# Patient Record
Sex: Female | Born: 1935 | Race: White | Hispanic: No | Smoking: Never smoker
Health system: Southern US, Community
[De-identification: ages and names within clinical notes are randomized; demographics above are authoritative.]

## PROBLEM LIST (undated history)

## (undated) DIAGNOSIS — I10 Essential (primary) hypertension: Secondary | ICD-10-CM

## (undated) DIAGNOSIS — E039 Hypothyroidism, unspecified: Secondary | ICD-10-CM

## (undated) DIAGNOSIS — E079 Disorder of thyroid, unspecified: Secondary | ICD-10-CM

## (undated) DIAGNOSIS — M1990 Unspecified osteoarthritis, unspecified site: Secondary | ICD-10-CM

## (undated) DIAGNOSIS — R350 Frequency of micturition: Secondary | ICD-10-CM

## (undated) DIAGNOSIS — C801 Malignant (primary) neoplasm, unspecified: Secondary | ICD-10-CM

## (undated) DIAGNOSIS — I739 Peripheral vascular disease, unspecified: Secondary | ICD-10-CM

## (undated) DIAGNOSIS — C4490 Unspecified malignant neoplasm of skin, unspecified: Secondary | ICD-10-CM

## (undated) HISTORY — PX: TONSILLECTOMY: SUR1361

## (undated) HISTORY — PX: MUCOSAL ADVANCEMENT FLAP: SHX6479

## (undated) HISTORY — DX: Unspecified malignant neoplasm of skin, unspecified: C44.90

## (undated) HISTORY — DX: Disorder of thyroid, unspecified: E07.9

## (undated) HISTORY — DX: Malignant (primary) neoplasm, unspecified: C80.1

## (undated) HISTORY — PX: EYE SURGERY: SHX253

## (undated) HISTORY — DX: Unspecified osteoarthritis, unspecified site: M19.90

---

## 1998-02-24 ENCOUNTER — Other Ambulatory Visit: Admission: RE | Admit: 1998-02-24 | Discharge: 1998-02-24 | Payer: Self-pay | Admitting: *Deleted

## 1999-02-17 ENCOUNTER — Other Ambulatory Visit: Admission: RE | Admit: 1999-02-17 | Discharge: 1999-02-17 | Payer: Self-pay | Admitting: *Deleted

## 2000-03-12 ENCOUNTER — Other Ambulatory Visit: Admission: RE | Admit: 2000-03-12 | Discharge: 2000-03-12 | Payer: Self-pay | Admitting: *Deleted

## 2002-01-26 ENCOUNTER — Encounter: Payer: Self-pay | Admitting: Family Medicine

## 2002-01-26 ENCOUNTER — Encounter: Admission: RE | Admit: 2002-01-26 | Discharge: 2002-01-26 | Payer: Self-pay | Admitting: Family Medicine

## 2002-12-08 ENCOUNTER — Encounter: Admission: RE | Admit: 2002-12-08 | Discharge: 2002-12-08 | Payer: Self-pay | Admitting: Family Medicine

## 2003-01-22 ENCOUNTER — Other Ambulatory Visit: Admission: RE | Admit: 2003-01-22 | Discharge: 2003-01-22 | Payer: Self-pay | Admitting: Family Medicine

## 2004-05-30 ENCOUNTER — Encounter: Admission: RE | Admit: 2004-05-30 | Discharge: 2004-05-30 | Payer: Self-pay | Admitting: Family Medicine

## 2005-04-23 ENCOUNTER — Encounter: Admission: RE | Admit: 2005-04-23 | Discharge: 2005-04-23 | Payer: Self-pay | Admitting: Family Medicine

## 2005-04-23 ENCOUNTER — Other Ambulatory Visit: Admission: RE | Admit: 2005-04-23 | Discharge: 2005-04-23 | Payer: Self-pay | Admitting: Family Medicine

## 2006-11-01 ENCOUNTER — Ambulatory Visit: Admission: RE | Admit: 2006-11-01 | Discharge: 2006-11-01 | Payer: Self-pay | Admitting: Family Medicine

## 2006-11-01 ENCOUNTER — Ambulatory Visit: Payer: Self-pay | Admitting: Surgery

## 2006-12-11 ENCOUNTER — Ambulatory Visit: Payer: Self-pay | Admitting: Vascular Surgery

## 2007-01-15 ENCOUNTER — Ambulatory Visit: Payer: Self-pay | Admitting: Vascular Surgery

## 2007-07-23 ENCOUNTER — Ambulatory Visit: Payer: Self-pay | Admitting: Vascular Surgery

## 2008-06-17 ENCOUNTER — Ambulatory Visit (HOSPITAL_COMMUNITY): Admission: RE | Admit: 2008-06-17 | Discharge: 2008-06-17 | Payer: Self-pay | Admitting: *Deleted

## 2009-09-12 ENCOUNTER — Encounter: Admission: RE | Admit: 2009-09-12 | Discharge: 2009-09-12 | Payer: Self-pay | Admitting: Internal Medicine

## 2010-06-27 NOTE — Assessment & Plan Note (Signed)
OFFICE VISIT   ROSALIN, BUSTER C  DOB:  23-May-1925                                       07/23/2007  JYNWG#:[id number]   The patient is a 77 year old female who has evidence of venous reflux in  her lower extremities, primarily the left leg.  She returns today for  followup.  She was last seen in December of 2008.  At that time she was  noted to have incompetence of her left great saphenous system.  She has  been wearing compression stocking since then and states that her leg  symptoms, heaviness and tiredness have all improved significantly.   PHYSICAL EXAMINATION:  On exam today blood pressure is 143/72 and pulse  is 89 and regular.  Lower extremities have trace edema bilaterally.  She  has 2+ dorsalis pedis pulses bilaterally.   The patient is satisfied currently with compression therapy alone.  I  have discussed with her that if she wishes to have laser venous ablation  at some point in the future we could consider this.  However, she states  that she is currently satisfied with the current management plan of  compression therapy alone.  She will follow up on an as-needed basis.   Janetta Hora. Fields, MD  Electronically Signed   CEF/MEDQ  D:  07/23/2007  T:  07/24/2007  Job:  [id number]   cc:   Quita Skye. Artis Flock, M.D.

## 2010-06-27 NOTE — Assessment & Plan Note (Signed)
OFFICE VISIT   [name], [name]  DOB:  March 17, 1925                                       12/11/2006  ZOXWR#:[id number]   The patient is referred by Dr. Artis [name] for intermittent left leg swelling  and erythema.  The symptoms have been present for approximately a month.  She has had several courses of antibiotics and the erythema finally  resolved.  She had a duplex ultrasound, which showed no evidence of DVT.  She also had an evaluation by a dermatologist, who told her it was  vascular disease.  She has no history of DVT.  She does have a history  of some varicose veins.  She states that she has pain in the medial  aspect of her left calf, which occurs intermittently.  It can occur at  rest or with walking.  It is improved by rubbing the skin.  She denies  any trauma to the area.  She does have edema in both lower extremities,  left worse than right.  She denies history of diabetes.  She does have a  history of hypertension and elevated cholesterol.  She denies history of  coronary artery disease.   PAST SURGICAL HISTORY:  Unremarkable.   PAST MEDICAL HISTORY:  She has hypothyroidism and the other medical  problems as listed above.   MEDICATIONS:  Levothyroxine.  Lipitor.  Tarka.   She states she is allergic to [facility], which causes confusion.   FAMILY HISTORY:  Unremarkable.   SOCIAL HISTORY:  She is widowed, retired.  She is a nonsmoker.  Non-  consumer of alcohol.   REVIEW OF SYSTEMS:  She is 5 feet 4 inches, 175 pounds.  She denies  recent weight loss or gain.  CARDIAC:  She has no history of chest pain, atrial fibrillation, or  cardiac arrhythmia.  PULMONARY:  She has no history of asthma or wheezing.  GASTROINTESTINAL:  She has no history of GI bleeding.  GENITOURINARY:  She has some urinary frequency, but denies renal  dysfunction.  VASCULAR:  She has no history of stroke or TIA.  NEUROLOGIC:  She has no history of dizziness or syncopal  episodes.  ORTHO:  She has mild arthritis.  PSYCHIATRIC:  She is alert and oriented x3.  No episodes of confusion.  ENT:  She has some decrease in her hearing acuity recently.  HEMATOLOGICAL:  She has no history of hypercoagulable state or bleeding  disorders.   PHYSICAL EXAM:  Blood pressure is 153/76 in the left arm.  Heart rate is  100.  HEENT is unremarkable.  She has 2+ carotid pulses with no bruit.  Chest is clear to auscultation.  Cardiac exam is regular rate and rhythm  without murmur.  Abdomen is obese, soft, and nontender, nondistended  with no mass.  She has 2+ radial, femoral, popliteal, dorsalis pedis,  and posterior tibial pulses bilaterally.  She has thickened areas of  skin bilaterally from the knee down with 1 to 2+ edema.  These are  symmetric.  She has a healing area of thickened skin with some scaling  of the epidermis in the left gaiter area.  There is no open wound  currently.   I believe the patient has some component of venous insufficiency  contributing to her lower extremity edema.  This may have been a venous  stasis ulcer  that was present previously.  I reassure her that her  arterial circulation is in good shape, and she is not at risk of limb  loss, but also informed her that she does not have a DVT and is not at  risk for pulmonary embolus.  I believe the best option for her is  compression therapy, and I have prescribed 25 to 30 mm compression  stockings for her today.  She will follow up in 1 month to see if her  edema symptoms are improving, and at that time, we will get a venous  duplex to step her competency and patency of her deep and superficial  venous system.   Mary Hora. Fields, MD  Electronically Signed   CEF/MEDQ  D:  12/12/2006  T:  12/12/2006  Job:  [id number]   cc:   Mary [name], M.D.

## 2010-06-27 NOTE — Procedures (Signed)
LOWER EXTREMITY VENOUS REFLUX EXAM   INDICATION:  Left leg edema.  The patient also has a past history of  venous ulcer on the left.   EXAM:  Using color-flow imaging and pulse Doppler spectral analysis, the  left common femoral, superficial femoral, popliteal, posterior tibial,  greater and lesser saphenous veins are evaluated.  There is no evidence  suggesting deep venous insufficiency in the left lower extremity.   The left saphenofemoral junction is competent.  The left GSV is not  competent with the caliber as described below.   The left proximal short saphenous vein is not compressible.   GSV Diameter (used if found to be incompetent only)                                            Right    Left  Proximal Greater Saphenous Vein                    0.57 cm  Proximal-to-mid-thigh                              0.49 cm  Mid thigh                                          0.44 cm  Mid-distal thigh                                   0.46 cm  Distal thigh                                       0.34 cm  Knee                                               0.43 cm  SFJ                                                0.47 cm   IMPRESSION:  1. Left greater saphenous vein reflux is identified with the caliber      ranging from 0.43 cm to 0.47 cm knee to groin.  2. The  left greater saphenous vein is not aneurysmal.  3. The left greater saphenous vein is not tortuous.  4. The deep venous system is competent.  5. The left lesser saphenous vein is not compressible.   ___________________________________________  Janetta Hora. Darrick Penna, MD   DP/MEDQ  D:  01/15/2007  T:  01/16/2007  Job:  [id number]

## 2010-06-27 NOTE — Assessment & Plan Note (Signed)
OFFICE VISIT   Lackey, Chalee C  DOB:  03-Sep-1925                                       01/15/2007  ZOXWR#:[id number]   The patient returns for followup for her lower extremity left leg  swelling.  She was last seen December 11, 2006.  She was prescribed  compression stockings at that time.  She states she has achieved  considerable relief with this.  Her leg swelling is much improved.  She  has minimal pain.  Overall, she is fairly satisfied.   PHYSICAL EXAM:  Blood pressure 140/75, pulse 85 and regular.  Lower  extremities, she still has trace edema in the left leg compared to the  right.  However, this is significantly improved.  There is some mild  pitting.  There are no ulcerations on the feet.  She has 2+ dorsalis  pedis pulses bilaterally.  She had a venous duplex exam today, which  shows reflux in her left greater saphenous vein with a diameter of 43 to  47 mm.  She had incompetence of the left greater saphenous vein.   The patient has had significant improvement of her leg swelling with  conservative management with compression stockings alone.  I discussed  with her the possibility of ablation of her left greater saphenous vein  and informed her that, if she is doing well with compression alone, that  should be adequate treatment.  She does not want any further procedures  at this time and is quite satisfied to continue with compression therapy  alone.  She will follow up in 6 months' time or sooner if she needs new  compression stockings.   Janetta Hora. Fields, MD  Electronically Signed   CEF/MEDQ  D:  01/16/2007  T:  01/16/2007  Job:  [id number]

## 2010-06-27 NOTE — Op Note (Signed)
[name], Mary [name]              ACCOUNT NO.:  [id number]   MEDICAL RECORD NO.:  [id number]          PATIENT TYPE:  AMB   LOCATION:  ENDO                         FACILITY:  [facility]   PHYSICIAN:  Georgiana Spinner, M.D.    DATE OF BIRTH:  1925-12-17   DATE OF PROCEDURE:  06/17/2008  DATE OF DISCHARGE:                               OPERATIVE REPORT   PROCEDURE:  Colonoscopy.   INDICATIONS:  Colon cancer screening.   ANESTHESIA:  1. Fentanyl 10 mcg.  2. Versed 1 mg.   PROCEDURE:  With the patient mildly sedated in the left lateral  decubitus position, the Pentax videoscopic pediatric colonoscope was  passed under direct vision from the rectum through a diverticular-filled  sigmoid colon to reach the cecum identified by ileocecal valve and  appendiceal orifice, both of which were photographed.  From this point  the colonoscope was slowly withdrawn, taking circumferential views of  the colonic mucosa, stopping only in the rectum which appeared normal on  direct and showed hemorrhoids on retroflexed view.  The endoscope was  straightened and withdrawn.  The patient's vital signs, pulse oximeter  remained stable.  The patient tolerated the procedure well without  apparent complication.   FINDINGS:  Diverticulosis, moderately severe, of sigmoid colon;  otherwise, an unremarkable colonoscopic examination other than  hemorrhoids.   PLAN:  Have patient follow-up with me as needed.           ______________________________  Georgiana Spinner, M.D.     GMO/MEDQ  D:  06/17/2008  T:  06/17/2008  Job:  [id number]

## 2010-06-27 NOTE — Op Note (Signed)
Mary [name], Mary [name]              ACCOUNT NO.:  [id number]   MEDICAL RECORD NO.:  [id number]          PATIENT TYPE:  AMB   LOCATION:  ENDO                         FACILITY:  [facility]   PHYSICIAN:  Georgiana Spinner, M.D.    DATE OF BIRTH:  Jun 13, 1925   DATE OF PROCEDURE:  06/17/2008  DATE OF DISCHARGE:                               OPERATIVE REPORT   PROCEDURE:  Upper endoscopy.   ENDOSCOPIST:  Georgiana Spinner, M.D.   INDICATIONS:  Gastroesophageal reflux disease.   ANESTHESIA:  Fentanyl 50 mcg, Versed 6 mg.   PROCEDURE:  With the patient mildly sedated in the left lateral  decubitus position, the Pentax videoscopic endoscope was inserted in the  mouth and passed under direct vision through the esophagus, which  appeared normal and into the stomach, fundus, body, antrum, duodenal  bulb and second portion of the duodenum which all appeared normal.  From  this point, the endoscope was slowly withdrawn, taking circumferential  views of the duodenal mucosa, until the endoscope had been pulled back  into stomach and placed in retroflexion, to view the stomach from below.  The endoscope was straightened and withdrawn, taking circumferential  views of the remaining gastric and esophageal mucosa.  The patient's  vital signs and pulse oximeter remained stable.   The patient tolerated procedure well without apparent complication.   FINDINGS:  This was an unremarkable examination.   PLAN:  Proceed to colonoscopy           ______________________________  Georgiana Spinner, M.D.     GMO/MEDQ  D:  06/17/2008  T:  06/17/2008  Job:  [id number]

## 2011-12-07 ENCOUNTER — Ambulatory Visit (INDEPENDENT_AMBULATORY_CARE_PROVIDER_SITE_OTHER): Payer: Medicare Other | Admitting: Family Medicine

## 2011-12-07 VITALS — BP 126/74 | HR 95 | Temp 98.6°F | Resp 17 | Ht 63.0 in | Wt 176.0 lb

## 2011-12-07 DIAGNOSIS — N390 Urinary tract infection, site not specified: Secondary | ICD-10-CM

## 2011-12-07 DIAGNOSIS — R509 Fever, unspecified: Secondary | ICD-10-CM

## 2011-12-07 DIAGNOSIS — D72829 Elevated white blood cell count, unspecified: Secondary | ICD-10-CM

## 2011-12-07 DIAGNOSIS — R109 Unspecified abdominal pain: Secondary | ICD-10-CM

## 2011-12-07 LAB — POCT URINALYSIS DIPSTICK
Bilirubin, UA: NEGATIVE
Glucose, UA: NEGATIVE
Ketones, UA: NEGATIVE
Nitrite, UA: POSITIVE
Protein, UA: NEGATIVE
Spec Grav, UA: 1.015
Urobilinogen, UA: 0.2
pH, UA: 5.5

## 2011-12-07 LAB — POCT UA - MICROSCOPIC ONLY
Casts, Ur, LPF, POC: NEGATIVE
Crystals, Ur, HPF, POC: NEGATIVE
Mucus, UA: POSITIVE
Yeast, UA: NEGATIVE

## 2011-12-07 LAB — POCT CBC
Granulocyte percent: 72.8 %G (ref 37–80)
HCT, POC: 37.7 % (ref 37.7–47.9)
Hemoglobin: 11.6 g/dL — AB (ref 12.2–16.2)
MCH, POC: 27.8 pg (ref 27–31.2)
MCV: 90.5 fL (ref 80–97)
MID (cbc): 1.1 — AB (ref 0–0.9)
RBC: 4.17 M/uL (ref 4.04–5.48)
WBC: 13.5 10*3/uL — AB (ref 4.6–10.2)

## 2011-12-07 MED ORDER — NITROFURANTOIN MONOHYD MACRO 100 MG PO CAPS: 100.0000 mg | ORAL_CAPSULE | Freq: Two times a day (BID) | ORAL | Status: DC

## 2011-12-07 NOTE — Progress Notes (Signed)
Subjective:    Patient ID: Mary [name], female    DOB: 1925-05-29, 78 y.o.   MRN: [id number]  HPI Mary [name] is a 78 y.o. female  Felt like had temp yesterday, chills - has not measured temp.  Thought was bladder or kidney infection as had one 6 months ago.  No dysuria, no change in urinary frequency - but goes frequently with lasix.  Has had some lower r abdominal pain - toward groin on and off since yesterday.  No vomiting. Had loose stool once this morning only.  Less appetite, but drinking fluids ok.     Review of Systems  Constitutional: Positive for fever (subjective. ) and chills.  HENT: Positive for rhinorrhea (chronic with mild allergies - seasonal ). Negative for congestion and sore throat.   Respiratory: Negative for cough and shortness of breath.   Cardiovascular: Negative for chest pain.  Gastrointestinal: Positive for abdominal pain and diarrhea. Negative for nausea, vomiting, constipation, blood in stool, abdominal distention and anal bleeding.       Objective:   Physical Exam  Constitutional: She is oriented to person, place, and time. She appears well-developed and well-nourished.  HENT:  Head: Normocephalic and atraumatic.  Eyes: Pupils are equal, round, and reactive to light.  Cardiovascular: Normal rate, regular rhythm, normal heart sounds and intact distal pulses.   Pulmonary/Chest: Effort normal and breath sounds normal.  Abdominal: Soft. Normal appearance. There is tenderness (min ruq greater than rlq, but mild, no repbound, negative murphy test. ). There is no rebound, no guarding and no CVA tenderness.  Neurological: She is alert and oriented to person, place, and time.  Skin: Skin is warm and dry. No rash noted.  Psychiatric: She has a normal mood and affect. Her behavior is normal.    Results for orders placed in visit on 12/07/11  POCT UA - MICROSCOPIC ONLY      Component Value Range   WBC, Ur, HPF, POC 7-12 w/clums     RBC, urine,  microscopic 1-3     Bacteria, U Microscopic 2+     Mucus, UA positive     Epithelial cells, urine per micros 0-3     Crystals, Ur, HPF, POC neg     Casts, Ur, LPF, POC neg     Yeast, UA neg    POCT URINALYSIS DIPSTICK      Component Value Range   Color, UA yellow     Clarity, UA sl. cloudy     Glucose, UA neg     Bilirubin, UA neg     Ketones, UA neg     Spec Grav, UA 1.015     Blood, UA trace-intact     pH, UA 5.5     Protein, UA neg     Urobilinogen, UA 0.2     Nitrite, UA positive     Leukocytes, UA moderate (2+)    POCT CBC      Component Value Range   WBC 13.5 (*) 4.6 - 10.2 K/uL   Lymph, poc 2.6  0.6 - 3.4   POC LYMPH PERCENT 19.3  10 - 50 %L   MID (cbc) 1.1 (*) 0 - 0.9   POC MID % 7.9  0 - 12 %M   POC Granulocyte 9.8 (*) 2 - 6.9   Granulocyte percent 72.8  37 - 80 %G   RBC 4.17  4.04 - 5.48 M/uL   Hemoglobin 11.6 (*) 12.2 - 16.2 g/dL   HCT, POC  37.7  37.7 - 47.9 %   MCV 90.5  80 - 97 fL   MCH, POC 27.8  27 - 31.2 pg   MCHC 30.8 (*) 31.8 - 35.4 g/dL   RDW, POC 16.1     Platelet Count, POC 225  142 - 424 K/uL   MPV 10.0  0 - 99.8 fL       Assessment & Plan:  Mary [name] is a 78 y.o. female 1. Abdominal pain  POCT UA - Microscopic Only, POCT urinalysis dipstick, POCT CBC  2. Fever  POCT CBC, Urine culture  3. UTI (lower urinary tract infection)  Urine culture, nitrofurantoin, macrocrystal-monohydrate, (MACROBID) 100 MG capsule  4. Leukocytosis     Probable uti as cause of subjective f/c and leukocytosis.  Start macrobid as above, fluids, sx care and recheck in next 3-4 days if not improving, sooner if worse.   Patient Instructions  Your should receive a call or letter about your lab results within the next week to 10 days.  Start the antibiotic for a likely urinary tract (bladder) infection. If not improving in the next 4 days - return to office for recheck Return to the clinic or go to the nearest emergency room if any of your symptoms worsen or new  symptoms occur. Urinary Tract Infection Urinary tract infections (UTIs) can develop anywhere along your urinary tract. Your urinary tract is your body's drainage system for removing wastes and extra water. Your urinary tract includes two kidneys, two ureters, a bladder, and a urethra. Your kidneys are a pair of bean-shaped organs. Each kidney is about the size of your fist. They are located below your ribs, one on each side of your spine. CAUSES Infections are caused by microbes, which are microscopic organisms, including fungi, viruses, and bacteria. These organisms are so small that they can only be seen through a microscope. Bacteria are the microbes that most commonly cause UTIs. SYMPTOMS  Symptoms of UTIs may vary by age and gender of the patient and by the location of the infection. Symptoms in young women typically include a frequent and intense urge to urinate and a painful, burning feeling in the bladder or urethra during urination. Older women and men are more likely to be tired, shaky, and weak and have muscle aches and abdominal pain. A fever may mean the infection is in your kidneys. Other symptoms of a kidney infection include pain in your back or sides below the ribs, nausea, and vomiting. DIAGNOSIS To diagnose a UTI, your caregiver will ask you about your symptoms. Your caregiver also will ask to provide a urine sample. The urine sample will be tested for bacteria and white blood cells. White blood cells are made by your body to help fight infection. TREATMENT  Typically, UTIs can be treated with medication. Because most UTIs are caused by a bacterial infection, they usually can be treated with the use of antibiotics. The choice of antibiotic and length of treatment depend on your symptoms and the type of bacteria causing your infection. HOME CARE INSTRUCTIONS  If you were prescribed antibiotics, take them exactly as your caregiver instructs you. Finish the medication even if you feel  better after you have only taken some of the medication.  Drink enough water and fluids to keep your urine clear or pale yellow.  Avoid caffeine, tea, and carbonated beverages. They tend to irritate your bladder.  Empty your bladder often. Avoid holding urine for long periods of time.  Empty your  bladder before and after sexual intercourse.  After a bowel movement, women should cleanse from front to back. Use each tissue only once. SEEK MEDICAL CARE IF:   You have back pain.  You develop a fever.  Your symptoms do not begin to resolve within 3 days. SEEK IMMEDIATE MEDICAL CARE IF:   You have severe back pain or lower abdominal pain.  You develop chills.  You have nausea or vomiting.  You have continued burning or discomfort with urination. MAKE SURE YOU:   Understand these instructions.  Will watch your condition.  Will get help right away if you are not doing well or get worse. Document Released: 11/08/2004 Document Revised: 07/31/2011 Document Reviewed: 03/09/2011 [facility] Patient Information 2013 [place], [place].

## 2011-12-07 NOTE — Patient Instructions (Addendum)
Your should receive a call or letter about your lab results within the next week to 10 days.  Start the antibiotic for a likely urinary tract (bladder) infection. If not improving in the next 4 days - return to office for recheck Return to the clinic or go to the nearest emergency room if any of your symptoms worsen or new symptoms occur. Urinary Tract Infection Urinary tract infections (UTIs) can develop anywhere along your urinary tract. Your urinary tract is your body's drainage system for removing wastes and extra water. Your urinary tract includes two kidneys, two ureters, a bladder, and a urethra. Your kidneys are a pair of bean-shaped organs. Each kidney is about the size of your fist. They are located below your ribs, one on each side of your spine. CAUSES Infections are caused by microbes, which are microscopic organisms, including fungi, viruses, and bacteria. These organisms are so small that they can only be seen through a microscope. Bacteria are the microbes that most commonly cause UTIs. SYMPTOMS  Symptoms of UTIs may vary by age and gender of the patient and by the location of the infection. Symptoms in young women typically include a frequent and intense urge to urinate and a painful, burning feeling in the bladder or urethra during urination. Older women and men are more likely to be tired, shaky, and weak and have muscle aches and abdominal pain. A fever may mean the infection is in your kidneys. Other symptoms of a kidney infection include pain in your back or sides below the ribs, nausea, and vomiting. DIAGNOSIS To diagnose a UTI, your caregiver will ask you about your symptoms. Your caregiver also will ask to provide a urine sample. The urine sample will be tested for bacteria and white blood cells. White blood cells are made by your body to help fight infection. TREATMENT  Typically, UTIs can be treated with medication. Because most UTIs are caused by a bacterial infection, they  usually can be treated with the use of antibiotics. The choice of antibiotic and length of treatment depend on your symptoms and the type of bacteria causing your infection. HOME CARE INSTRUCTIONS  If you were prescribed antibiotics, take them exactly as your caregiver instructs you. Finish the medication even if you feel better after you have only taken some of the medication.  Drink enough water and fluids to keep your urine clear or pale yellow.  Avoid caffeine, tea, and carbonated beverages. They tend to irritate your bladder.  Empty your bladder often. Avoid holding urine for long periods of time.  Empty your bladder before and after sexual intercourse.  After a bowel movement, women should cleanse from front to back. Use each tissue only once. SEEK MEDICAL CARE IF:   You have back pain.  You develop a fever.  Your symptoms do not begin to resolve within 3 days. SEEK IMMEDIATE MEDICAL CARE IF:   You have severe back pain or lower abdominal pain.  You develop chills.  You have nausea or vomiting.  You have continued burning or discomfort with urination. MAKE SURE YOU:   Understand these instructions.  Will watch your condition.  Will get help right away if you are not doing well or get worse. Document Released: 11/08/2004 Document Revised: 07/31/2011 Document Reviewed: 03/09/2011 [facility] Patient Information 2013 [place], [place].

## 2011-12-10 LAB — URINE CULTURE: Colony Count: >=100000

## 2013-07-19 NOTE — H&P (Signed)
  Shan[facility] [address] [place], [place] [address] [phone] A Division of [facility]  Ninetta Lights, M.D.   Robert A. Noemi Chapel, M.D.   Faythe Casa, M.D.   Johnny Bridge, M.D.   Almedia Balls, M.D Ernesta Amble. Percell Miller, M.D.  Joseph Pierini, M.D.  Lanier Prude, M.D.    Verner Chol, M.D. Mary L. Fenton Malling, PA-C  Kirstin A. Shepperson, PA-C  [place], PA-C [place], [place]   RE:  Mary [name], Mary [name] PROGRESS NOTE: 05-20-13 Reason for visit:  Referral from Dr. Noah Delaine for evaluation of left hip pain. History of present illness: This is an 80 year old who has had long-standing pain in her left groin radiating down her left thigh. She has multiple cortisone injections to her left hip with complete relief but did not last long. She takes occasional anti-inflammatory with minimal relief.   Please see associated documentation for this clinic visit for further past medical, family, surgical and social history, review of systems, and exam findings as this was reviewed by me.  EXAMINATION: Well appearing female in no apparent distress. Left lower extremity is neurovascularly intact, skin is benign, she has positive Stinchfield, pain with hip range of motion. Negative straight leg raise. No tenderness at the knee.  X-RAYS: X-rays reviewed by me:  2 views of her pelvis taken at an outside hospital demonstrates severe arthritis.  ASSESSMENT: Left hip osteoarthritis.  PLAN: 1. I had a lengthy discussion with her roughly 45 minutes discussing options. 2. As I see it she may continue non-operative treatment with NSAID's if approved by her primary care physician. At this point another injection is unlikely to last long as the previous one only lasted a few weeks. 3. I recommend she work on weight loss and flexibility. 4. Alternatively I think it is appropriate to perform a left total hip arthroplasty as  she has failed multiple non-operative measures.  5. She will consider options and discuss with her family.  Ernesta Amble.  Percell Miller, M.D.  Electronically verified by Ernesta Amble. Percell Miller, M.D. TDM:kah Cc:  Gavin Pound, MD fax [phone]  Cc:  Thressa Sheller, MD fax [phone]   D 05-20-13 T 05-21-13

## 2013-07-29 ENCOUNTER — Encounter (HOSPITAL_COMMUNITY): Payer: Self-pay

## 2013-07-31 ENCOUNTER — Encounter (HOSPITAL_COMMUNITY)
Admission: RE | Admit: 2013-07-31 | Discharge: 2013-07-31 | Disposition: A | Discharge status: Home / Self Care | Payer: Medicare Other | Source: Ambulatory Visit | Attending: Orthopedic Surgery | Admitting: Orthopedic Surgery

## 2013-07-31 ENCOUNTER — Encounter (HOSPITAL_COMMUNITY): Payer: Self-pay

## 2013-07-31 DIAGNOSIS — Z01812 Encounter for preprocedural laboratory examination: Secondary | ICD-10-CM | POA: Insufficient documentation

## 2013-07-31 HISTORY — DX: Hypothyroidism, unspecified: E03.9

## 2013-07-31 HISTORY — DX: Essential (primary) hypertension: I10

## 2013-07-31 HISTORY — DX: Peripheral vascular disease, unspecified: I73.9

## 2013-07-31 HISTORY — DX: Frequency of micturition: R35.0

## 2013-07-31 LAB — BASIC METABOLIC PANEL
BUN: 27 mg/dL — ABNORMAL HIGH (ref 6–23)
CHLORIDE: 97 meq/L (ref 96–112)
CO2: 24 mEq/L (ref 19–32)
Calcium: 10.1 mg/dL (ref 8.4–10.5)
Creatinine, Ser: 1.23 mg/dL — ABNORMAL HIGH (ref 0.50–1.10)
GFR calc Af Amer: 45 mL/min — ABNORMAL LOW (ref >90)
GFR, EST NON AFRICAN AMERICAN: 39 mL/min — AB (ref >90)
Glucose, Bld: 97 mg/dL (ref 70–99)
POTASSIUM: 4.3 meq/L (ref 3.7–5.3)
Sodium: 138 mEq/L (ref 137–147)

## 2013-07-31 LAB — CBC
HEMATOCRIT: 35.9 % — AB (ref 36.0–46.0)
HEMOGLOBIN: 11.7 g/dL — AB (ref 12.0–15.0)
MCH: 28.6 pg (ref 26.0–34.0)
MCHC: 32.6 g/dL (ref 30.0–36.0)
MCV: 87.8 fL (ref 78.0–100.0)
Platelets: 255 10*3/uL (ref 150–400)
RBC: 4.09 MIL/uL (ref 3.87–5.11)
RDW: 12.6 % (ref 11.5–15.5)
WBC: 10.5 10*3/uL (ref 4.0–10.5)

## 2013-07-31 LAB — URINALYSIS, ROUTINE W REFLEX MICROSCOPIC
BILIRUBIN URINE: NEGATIVE
Glucose, UA: NEGATIVE mg/dL
Hgb urine dipstick: NEGATIVE
KETONES UR: NEGATIVE mg/dL
Leukocytes, UA: NEGATIVE
NITRITE: NEGATIVE
PH: 5.5 (ref 5.0–8.0)
PROTEIN: NEGATIVE mg/dL
Specific Gravity, Urine: 1.012 (ref 1.005–1.030)
Urobilinogen, UA: 0.2 mg/dL (ref 0.0–1.0)

## 2013-07-31 LAB — PROTIME-INR
INR: 0.97 (ref 0.00–1.49)
Prothrombin Time: 12.7 seconds (ref 11.6–15.2)

## 2013-07-31 LAB — ABO/RH: ABO/RH(D): A NEG

## 2013-07-31 LAB — SURGICAL PCR SCREEN
MRSA, PCR: NEGATIVE
STAPHYLOCOCCUS AUREUS: NEGATIVE

## 2013-07-31 LAB — TYPE AND SCREEN
ABO/RH(D): A NEG
ANTIBODY SCREEN: NEGATIVE

## 2013-07-31 NOTE — Pre-Procedure Instructions (Signed)
Mary [name]  07/31/2013   Your procedure is scheduled on:  08-04-2013  Tuesday   Report to [facility] Admitting at 11:20 AM.  Call this number if you have problems the morning of surgery: [phone]   Remember:   Do not eat food or drink liquids after midnight.    Take these medicines the morning of surgery with A SIP OF WATER: Gapapentin(Neurotin) if needed,levothyroxine(Synthroid)   Do not wear jewelry, make-up or nail polish.  Do not wear lotions, powders, or perfumes.   Do not shave 48 hours prior to surgery.   Do not bring valuables to the hospital.  [facility] is not responsible for any belongings or valuables.               Contacts, dentures or bridgework may not be worn into surgery.   Leave suitcase in the car. After surgery it may be brought to your room.   For patients admitted to the hospital, discharge time is determined by your  treatment team.                   Special Instructions: See attached sheet for instructions on CHG shower/bath   Please read over the following fact sheets that you were given: Pain Booklet, Coughing and Deep Breathing, Blood Transfusion Information and Surgical Site Infection Prevention

## 2013-07-31 NOTE — Progress Notes (Signed)
OV,EKG,CXR requested from [facility]. Dr. Thressa Sheller.

## 2013-08-01 LAB — URINE CULTURE: Colony Count: 2000

## 2013-08-03 MED ORDER — ACETAMINOPHEN 500 MG PO TABS: 1000.0000 mg | ORAL_TABLET | Freq: Once | ORAL | Status: DC

## 2013-08-03 MED ORDER — TRANEXAMIC ACID 100 MG/ML IV SOLN
1000.0000 mg | INTRAVENOUS | Status: AC
  Administered 2013-08-04: 1000 mg via INTRAVENOUS
  Filled 2013-08-03: qty 10

## 2013-08-03 MED ORDER — DEXTROSE-NACL 5-0.45 % IV SOLN: 100.0000 mL/h | INTRAVENOUS | Status: DC

## 2013-08-03 MED ORDER — CHLORHEXIDINE GLUCONATE 4 % EX LIQD
60.0000 mL | Freq: Once | CUTANEOUS | Status: DC
  Filled 2013-08-03: qty 60

## 2013-08-03 MED ORDER — CEFAZOLIN SODIUM-DEXTROSE 2-3 GM-% IV SOLR
2.0000 g | INTRAVENOUS | Status: AC
  Administered 2013-08-04: 2 g via INTRAVENOUS

## 2013-08-03 NOTE — Progress Notes (Signed)
Call to [facility], spoke with Lattie Haw , requested last OV note, EKG, CXR To be faxed to [facility] [phone].

## 2013-08-03 NOTE — Progress Notes (Signed)
Spoke with pt and informed her of surgery time change and to arrive at 0800 with same preop instructions.  Stated understanding.

## 2013-08-04 ENCOUNTER — Inpatient Hospital Stay (HOSPITAL_COMMUNITY): Payer: Medicare Other

## 2013-08-04 ENCOUNTER — Encounter (HOSPITAL_COMMUNITY)
Admission: RE | Disposition: A | Discharge status: Skilled Nursing Facility | Payer: Self-pay | Source: Ambulatory Visit | Attending: Orthopedic Surgery

## 2013-08-04 ENCOUNTER — Encounter (HOSPITAL_COMMUNITY): Payer: Medicare Other | Admitting: Anesthesiology

## 2013-08-04 ENCOUNTER — Inpatient Hospital Stay (HOSPITAL_COMMUNITY)
Admission: RE | Admit: 2013-08-04 | Discharge: 2013-08-07 | DRG: 470 | Disposition: A | Discharge status: Skilled Nursing Facility | Payer: Medicare Other | Source: Ambulatory Visit | Attending: Orthopedic Surgery | Admitting: Orthopedic Surgery

## 2013-08-04 ENCOUNTER — Encounter (HOSPITAL_COMMUNITY): Payer: Self-pay | Admitting: Certified Registered"

## 2013-08-04 ENCOUNTER — Inpatient Hospital Stay (HOSPITAL_COMMUNITY): Payer: Medicare Other | Admitting: Anesthesiology

## 2013-08-04 DIAGNOSIS — M1610 Unilateral primary osteoarthritis, unspecified hip: Principal | ICD-10-CM | POA: Diagnosis present

## 2013-08-04 DIAGNOSIS — Z85828 Personal history of other malignant neoplasm of skin: Secondary | ICD-10-CM

## 2013-08-04 DIAGNOSIS — E039 Hypothyroidism, unspecified: Secondary | ICD-10-CM | POA: Diagnosis present

## 2013-08-04 DIAGNOSIS — M1990 Unspecified osteoarthritis, unspecified site: Secondary | ICD-10-CM

## 2013-08-04 DIAGNOSIS — Z79899 Other long term (current) drug therapy: Secondary | ICD-10-CM

## 2013-08-04 DIAGNOSIS — I739 Peripheral vascular disease, unspecified: Secondary | ICD-10-CM | POA: Diagnosis present

## 2013-08-04 DIAGNOSIS — I10 Essential (primary) hypertension: Secondary | ICD-10-CM | POA: Diagnosis present

## 2013-08-04 DIAGNOSIS — M1612 Unilateral primary osteoarthritis, left hip: Secondary | ICD-10-CM

## 2013-08-04 DIAGNOSIS — Z7982 Long term (current) use of aspirin: Secondary | ICD-10-CM

## 2013-08-04 DIAGNOSIS — M169 Osteoarthritis of hip, unspecified: Principal | ICD-10-CM | POA: Diagnosis present

## 2013-08-04 HISTORY — PX: TOTAL HIP ARTHROPLASTY: SHX124

## 2013-08-04 SURGERY — ARTHROPLASTY, HIP, TOTAL, ANTERIOR APPROACH
Anesthesia: General | Site: Hip | Laterality: Left

## 2013-08-04 MED ORDER — FENTANYL CITRATE 0.05 MG/ML IJ SOLN
INTRAMUSCULAR | Status: AC
  Filled 2013-08-04: qty 5

## 2013-08-04 MED ORDER — MEPERIDINE HCL 25 MG/ML IJ SOLN: 6.2500 mg | INTRAMUSCULAR | Status: DC | PRN

## 2013-08-04 MED ORDER — MENTHOL 3 MG MT LOZG: 1.0000 | LOZENGE | OROMUCOSAL | Status: DC | PRN

## 2013-08-04 MED ORDER — FUROSEMIDE 20 MG PO TABS
10.0000 mg | ORAL_TABLET | ORAL | Status: DC
  Administered 2013-08-05 – 2013-08-07 (×2): 10 mg via ORAL
  Filled 2013-08-04 (×2): qty 0.5

## 2013-08-04 MED ORDER — 0.9 % SODIUM CHLORIDE (POUR BTL) OPTIME
TOPICAL | Status: DC | PRN
  Administered 2013-08-04: 1000 mL

## 2013-08-04 MED ORDER — GABAPENTIN 300 MG PO CAPS
300.0000 mg | ORAL_CAPSULE | Freq: Every day | ORAL | Status: DC | PRN
  Administered 2013-08-05: 300 mg via ORAL
  Filled 2013-08-04 (×2): qty 1

## 2013-08-04 MED ORDER — LACTATED RINGERS IV SOLN
INTRAVENOUS | Status: DC | PRN
  Administered 2013-08-04: 10:00:00 via INTRAVENOUS

## 2013-08-04 MED ORDER — SUCCINYLCHOLINE CHLORIDE 20 MG/ML IJ SOLN
INTRAMUSCULAR | Status: AC
  Filled 2013-08-04: qty 1

## 2013-08-04 MED ORDER — METOCLOPRAMIDE HCL 5 MG/ML IJ SOLN: 5.0000 mg | Freq: Three times a day (TID) | INTRAMUSCULAR | Status: DC | PRN

## 2013-08-04 MED ORDER — HYDROMORPHONE HCL PF 1 MG/ML IJ SOLN
INTRAMUSCULAR | Status: AC
  Filled 2013-08-04: qty 1

## 2013-08-04 MED ORDER — DEXTROSE-NACL 5-0.45 % IV SOLN
INTRAVENOUS | Status: DC
  Administered 2013-08-04: 23:00:00 via INTRAVENOUS

## 2013-08-04 MED ORDER — METOCLOPRAMIDE HCL 10 MG PO TABS: 5.0000 mg | ORAL_TABLET | Freq: Three times a day (TID) | ORAL | Status: DC | PRN

## 2013-08-04 MED ORDER — LACTATED RINGERS IV SOLN
INTRAVENOUS | Status: DC
  Administered 2013-08-04: 09:00:00 via INTRAVENOUS

## 2013-08-04 MED ORDER — CEFAZOLIN SODIUM-DEXTROSE 2-3 GM-% IV SOLR
2.0000 g | Freq: Four times a day (QID) | INTRAVENOUS | Status: DC
  Filled 2013-08-04 (×2): qty 50

## 2013-08-04 MED ORDER — ALBUMIN HUMAN 5 % IV SOLN
INTRAVENOUS | Status: DC | PRN
  Administered 2013-08-04 (×2): via INTRAVENOUS

## 2013-08-04 MED ORDER — MORPHINE SULFATE 2 MG/ML IJ SOLN
2.0000 mg | INTRAMUSCULAR | Status: DC | PRN
  Administered 2013-08-04 – 2013-08-06 (×3): 2 mg via INTRAVENOUS
  Filled 2013-08-04 (×3): qty 1

## 2013-08-04 MED ORDER — METHOCARBAMOL 500 MG PO TABS
500.0000 mg | ORAL_TABLET | Freq: Four times a day (QID) | ORAL | Status: DC | PRN
  Administered 2013-08-04 – 2013-08-07 (×5): 500 mg via ORAL
  Filled 2013-08-04 (×4): qty 1

## 2013-08-04 MED ORDER — PHENYLEPHRINE HCL 10 MG/ML IJ SOLN
10.0000 mg | INTRAVENOUS | Status: DC | PRN
  Administered 2013-08-04: 10 ug/min via INTRAVENOUS

## 2013-08-04 MED ORDER — ONDANSETRON HCL 4 MG PO TABS: 4.0000 mg | ORAL_TABLET | Freq: Three times a day (TID) | ORAL | Status: DC | PRN

## 2013-08-04 MED ORDER — ONDANSETRON HCL 4 MG/2ML IJ SOLN: 4.0000 mg | Freq: Once | INTRAMUSCULAR | Status: DC | PRN

## 2013-08-04 MED ORDER — ROCURONIUM BROMIDE 100 MG/10ML IV SOLN
INTRAVENOUS | Status: DC | PRN
  Administered 2013-08-04: 50 mg via INTRAVENOUS

## 2013-08-04 MED ORDER — OXYCODONE HCL 5 MG/5ML PO SOLN: 5.0000 mg | Freq: Once | ORAL | Status: DC | PRN

## 2013-08-04 MED ORDER — HYDROCODONE-ACETAMINOPHEN 5-325 MG PO TABS: 1.0000 | ORAL_TABLET | ORAL | Status: DC | PRN

## 2013-08-04 MED ORDER — ATORVASTATIN CALCIUM 10 MG PO TABS
10.0000 mg | ORAL_TABLET | Freq: Every day | ORAL | Status: DC
  Administered 2013-08-04 – 2013-08-07 (×4): 10 mg via ORAL
  Filled 2013-08-04 (×4): qty 1

## 2013-08-04 MED ORDER — CEFAZOLIN SODIUM-DEXTROSE 2-3 GM-% IV SOLR
INTRAVENOUS | Status: AC
  Filled 2013-08-04: qty 50

## 2013-08-04 MED ORDER — HYDROCODONE-ACETAMINOPHEN 5-325 MG PO TABS
ORAL_TABLET | ORAL | Status: AC
  Filled 2013-08-04: qty 2

## 2013-08-04 MED ORDER — NEOSTIGMINE METHYLSULFATE 10 MG/10ML IV SOLN
INTRAVENOUS | Status: DC | PRN
  Administered 2013-08-04: 4 mg via INTRAVENOUS

## 2013-08-04 MED ORDER — HYDROMORPHONE HCL PF 1 MG/ML IJ SOLN
0.2500 mg | INTRAMUSCULAR | Status: DC | PRN
  Administered 2013-08-04 (×2): 0.5 mg via INTRAVENOUS

## 2013-08-04 MED ORDER — DEXAMETHASONE SODIUM PHOSPHATE 10 MG/ML IJ SOLN
INTRAMUSCULAR | Status: DC | PRN
  Administered 2013-08-04: 6 mg via INTRAVENOUS
  Administered 2013-08-04: 4 mg via INTRAVENOUS

## 2013-08-04 MED ORDER — BUPIVACAINE LIPOSOME 1.3 % IJ SUSP
20.0000 mL | INTRAMUSCULAR | Status: DC
  Filled 2013-08-04: qty 20

## 2013-08-04 MED ORDER — PHENOL 1.4 % MT LIQD: 1.0000 | OROMUCOSAL | Status: DC | PRN

## 2013-08-04 MED ORDER — FUROSEMIDE 20 MG PO TABS
20.0000 mg | ORAL_TABLET | ORAL | Status: DC
  Administered 2013-08-04 – 2013-08-06 (×2): 20 mg via ORAL
  Filled 2013-08-04 (×2): qty 1

## 2013-08-04 MED ORDER — GLYCOPYRROLATE 0.2 MG/ML IJ SOLN
INTRAMUSCULAR | Status: DC | PRN
  Administered 2013-08-04: 0.2 mg via INTRAVENOUS
  Administered 2013-08-04: 0.6 mg via INTRAVENOUS
  Administered 2013-08-04: 0.2 mg via INTRAVENOUS

## 2013-08-04 MED ORDER — LEVOTHYROXINE SODIUM 50 MCG PO TABS
50.0000 ug | ORAL_TABLET | Freq: Every day | ORAL | Status: DC
  Administered 2013-08-05 – 2013-08-07 (×3): 50 ug via ORAL
  Filled 2013-08-04 (×5): qty 1

## 2013-08-04 MED ORDER — OXYCODONE HCL 5 MG PO TABS: 5.0000 mg | ORAL_TABLET | Freq: Once | ORAL | Status: DC | PRN

## 2013-08-04 MED ORDER — CELECOXIB 200 MG PO CAPS: 200.0000 mg | ORAL_CAPSULE | Freq: Two times a day (BID) | ORAL | Status: DC

## 2013-08-04 MED ORDER — DEXAMETHASONE SODIUM PHOSPHATE 10 MG/ML IJ SOLN
10.0000 mg | Freq: Three times a day (TID) | INTRAMUSCULAR | Status: AC
  Administered 2013-08-04 – 2013-08-05 (×2): 10 mg via INTRAVENOUS
  Filled 2013-08-04 (×3): qty 1

## 2013-08-04 MED ORDER — HYDROCODONE-ACETAMINOPHEN 5-325 MG PO TABS
1.0000 | ORAL_TABLET | ORAL | Status: DC | PRN
  Administered 2013-08-04 – 2013-08-07 (×11): 2 via ORAL
  Filled 2013-08-04 (×11): qty 2

## 2013-08-04 MED ORDER — ONDANSETRON HCL 4 MG/2ML IJ SOLN: 4.0000 mg | Freq: Four times a day (QID) | INTRAMUSCULAR | Status: DC | PRN

## 2013-08-04 MED ORDER — ACETAMINOPHEN 325 MG PO TABS: 650.0000 mg | ORAL_TABLET | Freq: Four times a day (QID) | ORAL | Status: DC | PRN

## 2013-08-04 MED ORDER — PROPOFOL 10 MG/ML IV BOLUS
INTRAVENOUS | Status: DC | PRN
  Administered 2013-08-04: 100 mg via INTRAVENOUS

## 2013-08-04 MED ORDER — METHOCARBAMOL 500 MG PO TABS: 500.0000 mg | ORAL_TABLET | Freq: Four times a day (QID) | ORAL | Status: DC | PRN

## 2013-08-04 MED ORDER — ONDANSETRON HCL 4 MG PO TABS: 4.0000 mg | ORAL_TABLET | Freq: Four times a day (QID) | ORAL | Status: DC | PRN

## 2013-08-04 MED ORDER — METHOCARBAMOL 1000 MG/10ML IJ SOLN
500.0000 mg | Freq: Four times a day (QID) | INTRAVENOUS | Status: DC | PRN
  Filled 2013-08-04: qty 5

## 2013-08-04 MED ORDER — VERAPAMIL HCL 240 MG (CO) PO TB24: 240.0000 mg | ORAL_TABLET | Freq: Every day | ORAL | Status: DC

## 2013-08-04 MED ORDER — ROCURONIUM BROMIDE 50 MG/5ML IV SOLN
INTRAVENOUS | Status: AC
  Filled 2013-08-04: qty 1

## 2013-08-04 MED ORDER — CELECOXIB 200 MG PO CAPS
200.0000 mg | ORAL_CAPSULE | Freq: Two times a day (BID) | ORAL | Status: DC
  Administered 2013-08-04 – 2013-08-07 (×7): 200 mg via ORAL
  Filled 2013-08-04 (×8): qty 1

## 2013-08-04 MED ORDER — DOCUSATE SODIUM 100 MG PO CAPS
100.0000 mg | ORAL_CAPSULE | Freq: Two times a day (BID) | ORAL | Status: DC
  Administered 2013-08-04 – 2013-08-07 (×6): 100 mg via ORAL
  Filled 2013-08-04 (×6): qty 1

## 2013-08-04 MED ORDER — ONDANSETRON HCL 4 MG/2ML IJ SOLN
INTRAMUSCULAR | Status: DC | PRN
  Administered 2013-08-04: 4 mg via INTRAVENOUS

## 2013-08-04 MED ORDER — LOSARTAN POTASSIUM 50 MG PO TABS
100.0000 mg | ORAL_TABLET | Freq: Every day | ORAL | Status: DC
  Administered 2013-08-04 – 2013-08-07 (×4): 100 mg via ORAL
  Filled 2013-08-04 (×4): qty 2

## 2013-08-04 MED ORDER — SODIUM CHLORIDE 0.9 % IR SOLN
Status: DC | PRN
  Administered 2013-08-04: 500 mL

## 2013-08-04 MED ORDER — LIDOCAINE HCL (CARDIAC) 20 MG/ML IV SOLN
INTRAVENOUS | Status: DC | PRN
  Administered 2013-08-04: 60 mg via INTRAVENOUS

## 2013-08-04 MED ORDER — FENTANYL CITRATE 0.05 MG/ML IJ SOLN
INTRAMUSCULAR | Status: DC | PRN
  Administered 2013-08-04 (×4): 50 ug via INTRAVENOUS
  Administered 2013-08-04: 150 ug via INTRAVENOUS

## 2013-08-04 MED ORDER — CEFAZOLIN SODIUM-DEXTROSE 2-3 GM-% IV SOLR
2.0000 g | Freq: Three times a day (TID) | INTRAVENOUS | Status: DC
  Administered 2013-08-04 – 2013-08-05 (×3): 2 g via INTRAVENOUS
  Filled 2013-08-04 (×6): qty 50

## 2013-08-04 MED ORDER — METHOCARBAMOL 500 MG PO TABS
ORAL_TABLET | ORAL | Status: AC
Start: 2013-08-04 — End: 2013-08-05
  Filled 2013-08-04: qty 1

## 2013-08-04 MED ORDER — VERAPAMIL HCL ER 240 MG PO TBCR
240.0000 mg | EXTENDED_RELEASE_TABLET | Freq: Every day | ORAL | Status: DC
  Administered 2013-08-04 – 2013-08-06 (×3): 240 mg via ORAL
  Filled 2013-08-04 (×5): qty 1

## 2013-08-04 MED ORDER — ASPIRIN EC 325 MG PO TBEC: 325.0000 mg | DELAYED_RELEASE_TABLET | Freq: Every day | ORAL | Status: DC

## 2013-08-04 MED ORDER — LOSARTAN POTASSIUM 50 MG PO TABS: 100.0000 mg | ORAL_TABLET | Freq: Every day | ORAL | Status: DC

## 2013-08-04 MED ORDER — PROPOFOL 10 MG/ML IV BOLUS
INTRAVENOUS | Status: AC
  Filled 2013-08-04: qty 20

## 2013-08-04 MED ORDER — ASPIRIN EC 325 MG PO TBEC
325.0000 mg | DELAYED_RELEASE_TABLET | Freq: Every day | ORAL | Status: DC
  Administered 2013-08-05 – 2013-08-07 (×3): 325 mg via ORAL
  Filled 2013-08-04 (×4): qty 1

## 2013-08-04 MED ORDER — BUPIVACAINE LIPOSOME 1.3 % IJ SUSP
INTRAMUSCULAR | Status: DC | PRN
  Administered 2013-08-04: 20 mL

## 2013-08-04 MED ORDER — ACETAMINOPHEN 650 MG RE SUPP: 650.0000 mg | Freq: Four times a day (QID) | RECTAL | Status: DC | PRN

## 2013-08-04 MED ORDER — CELECOXIB 200 MG PO CAPS
ORAL_CAPSULE | ORAL | Status: AC
Start: 2013-08-04 — End: 2013-08-05
  Filled 2013-08-04: qty 1

## 2013-08-04 MED ORDER — DEXAMETHASONE 4 MG PO TABS
10.0000 mg | ORAL_TABLET | Freq: Three times a day (TID) | ORAL | Status: AC
  Administered 2013-08-05: 10 mg via ORAL
  Filled 2013-08-04 (×3): qty 1

## 2013-08-04 MED ORDER — DOCUSATE SODIUM 100 MG PO CAPS: 100.0000 mg | ORAL_CAPSULE | Freq: Two times a day (BID) | ORAL | Status: AC

## 2013-08-04 SURGICAL SUPPLY — 66 items
APL SKNCLS STERI-STRIP NONHPOA (GAUZE/BANDAGES/DRESSINGS) ×1
BENZOIN TINCTURE PRP APPL 2/3 (GAUZE/BANDAGES/DRESSINGS) ×2 IMPLANT
BLADE SAW SGTL 18X1.27X75 (BLADE) ×2 IMPLANT
BLADE SAW SGTL 18X1.27X75MM (BLADE) ×1
BLADE SURG ROTATE 9660 (MISCELLANEOUS) IMPLANT
CLOSURE STERI-STRIP 1/2X4 (GAUZE/BANDAGES/DRESSINGS) ×1
CLSR STERI-STRIP ANTIMIC 1/2X4 (GAUZE/BANDAGES/DRESSINGS) ×1 IMPLANT
COVER SURGICAL LIGHT HANDLE (MISCELLANEOUS) ×3 IMPLANT
DRAPE C-ARM 42X72 X-RAY (DRAPES) ×1 IMPLANT
DRAPE IMP U-DRAPE 54X76 (DRAPES) ×3 IMPLANT
DRAPE INCISE IOBAN 66X45 STRL (DRAPES) ×3 IMPLANT
DRAPE ORTHO SPLIT 77X108 STRL (DRAPES) ×6
DRAPE PROXIMA HALF (DRAPES) ×3 IMPLANT
DRAPE SURG 17X23 STRL (DRAPES) ×3 IMPLANT
DRAPE SURG ORHT 6 SPLT 77X108 (DRAPES) ×2 IMPLANT
DRAPE U-SHAPE 47X51 STRL (DRAPES) ×4 IMPLANT
DRSG AQUACEL AG ADV 3.5X10 (GAUZE/BANDAGES/DRESSINGS) ×3 IMPLANT
DURAPREP 26ML APPLICATOR (WOUND CARE) ×3 IMPLANT
ELECT BLADE 4.0 EZ CLEAN MEGAD (MISCELLANEOUS) ×3
ELECT CAUTERY BLADE 6.4 (BLADE) ×3 IMPLANT
ELECT REM PT RETURN 9FT ADLT (ELECTROSURGICAL) ×3
ELECTRODE BLDE 4.0 EZ CLN MEGD (MISCELLANEOUS) ×1 IMPLANT
ELECTRODE REM PT RTRN 9FT ADLT (ELECTROSURGICAL) ×1 IMPLANT
FACESHIELD WRAPAROUND (MASK) ×6 IMPLANT
FACESHIELD WRAPAROUND OR TEAM (MASK) ×2 IMPLANT
GLOVE BIO SURGEON STRL SZ7.5 (GLOVE) ×5 IMPLANT
GLOVE BIOGEL M 7.0 STRL (GLOVE) IMPLANT
GLOVE BIOGEL PI IND STRL 7.0 (GLOVE) IMPLANT
GLOVE BIOGEL PI IND STRL 7.5 (GLOVE) IMPLANT
GLOVE BIOGEL PI IND STRL 8 (GLOVE) ×2 IMPLANT
GLOVE BIOGEL PI INDICATOR 7.0 (GLOVE) ×2
GLOVE BIOGEL PI INDICATOR 7.5 (GLOVE)
GLOVE BIOGEL PI INDICATOR 8 (GLOVE) ×6
GLOVE BIOGEL PI ORTHO PRO SZ8 (GLOVE)
GLOVE PI ORTHO PRO STRL SZ8 (GLOVE) IMPLANT
GLOVE SURG ORTHO 8.0 STRL STRW (GLOVE) IMPLANT
GLOVE SURG SS PI 7.0 STRL IVOR (GLOVE) ×2 IMPLANT
GOWN STRL REUS W/ TWL LRG LVL3 (GOWN DISPOSABLE) ×3 IMPLANT
GOWN STRL REUS W/ TWL XL LVL3 (GOWN DISPOSABLE) ×1 IMPLANT
GOWN STRL REUS W/TWL LRG LVL3 (GOWN DISPOSABLE) ×9
GOWN STRL REUS W/TWL XL LVL3 (GOWN DISPOSABLE) ×3
HIP/CERM HD VIT E LINR LEV 1C ×2 IMPLANT
KIT BASIN OR (CUSTOM PROCEDURE TRAY) ×3 IMPLANT
KIT ROOM TURNOVER OR (KITS) ×3 IMPLANT
MANIFOLD NEPTUNE II (INSTRUMENTS) ×1 IMPLANT
NDL SAFETY ECLIPSE 18X1.5 (NEEDLE) ×1 IMPLANT
NEEDLE HYPO 18GX1.5 SHARP (NEEDLE) ×3
NS IRRIG 1000ML POUR BTL (IV SOLUTION) ×3 IMPLANT
PACK TOTAL JOINT (CUSTOM PROCEDURE TRAY) ×3 IMPLANT
PAD ARMBOARD 7.5X6 YLW CONV (MISCELLANEOUS) ×6 IMPLANT
PILLOW ABDUCTION HIP (SOFTGOODS) IMPLANT
SEALER BIPOLAR AQUA 6.0 (INSTRUMENTS) ×2 IMPLANT
SPONGE LAP 18X18 X RAY DECT (DISPOSABLE) IMPLANT
SUT MNCRL AB 4-0 PS2 18 (SUTURE) ×3 IMPLANT
SUT MON AB 2-0 CT1 36 (SUTURE) ×3 IMPLANT
SUT VIC AB 0 CT1 27 (SUTURE) ×3
SUT VIC AB 0 CT1 27XBRD ANBCTR (SUTURE) ×1 IMPLANT
SUT VIC AB 1 CT1 27 (SUTURE) ×3
SUT VIC AB 1 CT1 27XBRD ANBCTR (SUTURE) ×1 IMPLANT
SYR 50ML LL SCALE MARK (SYRINGE) ×3 IMPLANT
TOWEL OR 17X24 6PK STRL BLUE (TOWEL DISPOSABLE) ×3 IMPLANT
TOWEL OR 17X26 10 PK STRL BLUE (TOWEL DISPOSABLE) ×3 IMPLANT
TOWEL OR NON WOVEN STRL DISP B (DISPOSABLE) ×3 IMPLANT
TRAY CATH 16FR W/PLASTIC CATH (SET/KITS/TRAYS/PACK) ×2 IMPLANT
TRAY FOLEY CATH 16FRSI W/METER (SET/KITS/TRAYS/PACK) IMPLANT
WATER STERILE IRR 1000ML POUR (IV SOLUTION) ×3 IMPLANT

## 2013-08-04 NOTE — Progress Notes (Signed)
Utilization review completed.  

## 2013-08-04 NOTE — Anesthesia Preprocedure Evaluation (Addendum)
Anesthesia Evaluation  Patient identified by MRN, date of birth, ID band Patient awake    Reviewed: Allergy & Precautions, H&P , NPO status , Patient's Chart, lab work & pertinent test results, reviewed documented beta blocker date and time   History of Anesthesia Complications (+) PROLONGED EMERGENCE  Airway Mallampati: I TM Distance: >3 FB Neck ROM: Full    Dental  (+) Teeth Intact, Dental Advisory Given   Pulmonary  breath sounds clear to auscultation        Cardiovascular hypertension, Pt. on medications + Peripheral Vascular Disease Rhythm:Regular     Neuro/Psych    GI/Hepatic   Endo/Other  Hypothyroidism   Renal/GU      Musculoskeletal   Abdominal (+)  Abdomen: soft. Bowel sounds: normal.  Peds  Hematology   Anesthesia Other Findings   Reproductive/Obstetrics                       Anesthesia Physical Anesthesia Plan  ASA: III  Anesthesia Plan: General   Post-op Pain Management:    Induction: Intravenous  Airway Management Planned: Oral ETT  Additional Equipment:   Intra-op Plan:   Post-operative Plan: Extubation in OR  Informed Consent: I have reviewed the patients History and Physical, chart, labs and discussed the procedure including the risks, benefits and alternatives for the proposed anesthesia with the patient or authorized representative who has indicated his/her understanding and acceptance.     Plan Discussed with: CRNA and Surgeon  Anesthesia Plan Comments:        Anesthesia Quick Evaluation

## 2013-08-04 NOTE — Anesthesia Procedure Notes (Signed)
Procedure Name: Intubation Date/Time: 08/04/2013 10:05 AM Performed by: Maeola Harman Pre-anesthesia Checklist: Patient identified, Emergency Drugs available, Suction available, Patient being monitored and Timeout performed Patient Re-evaluated:Patient Re-evaluated prior to inductionOxygen Delivery Method: Circle system utilized Preoxygenation: Pre-oxygenation with 100% oxygen Intubation Type: IV induction Ventilation: Mask ventilation without difficulty Laryngoscope Size: Mac and 3 Grade View: Grade I Tube type: Oral Tube size: 7.5 mm Number of attempts: 1 Airway Equipment and Method: Stylet Placement Confirmation: ETT inserted through vocal cords under direct vision,  positive ETCO2 and breath sounds checked- equal and bilateral Secured at: 22 cm Tube secured with: Tape Dental Injury: Teeth and Oropharynx as per pre-operative assessment  Comments: Easy atraumatic induction and intubation with MAC 3 blade.  Dr. Conrad Oakdale verified placement of ETT.  Waldron Session, CRNA

## 2013-08-04 NOTE — Progress Notes (Signed)
Unable to obtain oral/temporal or rectal temp, feels warm to touch and pt denies feeling cold and hase not exhibited any signs of same. Will continue Bair hugger and recheck temp in 15 mins

## 2013-08-04 NOTE — Transfer of Care (Signed)
Immediate Anesthesia Transfer of Care Note  Patient: Mary [name]  Procedure(s) Performed: Procedure(s): LEFT TOTAL HIP ARTHROPLASTY ANTERIOR APPROACH (Left)  Patient Location: PACU  Anesthesia Type:General  Level of Consciousness: awake, alert  and sedated  Airway & Oxygen Therapy: Patient connected to face mask oxygen  Post-op Assessment: Report given to PACU RN  Post vital signs: stable  Complications: No apparent anesthesia complications

## 2013-08-04 NOTE — Discharge Instructions (Signed)
Maintain anterior hip precautions  Bear weight as tolerated  Take a full strength aspirin daily for 30 days

## 2013-08-04 NOTE — Op Note (Signed)
08/04/2013  12:02 PM  PATIENT:  Mary [name]   MRN: [id number]  PRE-OPERATIVE DIAGNOSIS:  OA LEFT HIP  POST-OPERATIVE DIAGNOSIS:  OA LEFT HIP  PROCEDURE:  Procedure(s): LEFT TOTAL HIP ARTHROPLASTY ANTERIOR APPROACH  PREOPERATIVE INDICATIONS:    [name] is an 80 y.o. female who has a diagnosis of <principal problem not specified> and elected for surgical management after failing conservative treatment.  The risks benefits and alternatives were discussed with the patient including but not limited to the risks of nonoperative treatment, versus surgical intervention including infection, bleeding, nerve injury, periprosthetic fracture, the need for revision surgery, dislocation, leg length discrepancy, blood clots, cardiopulmonary complications, morbidity, mortality, among others, and they were willing to proceed.     OPERATIVE REPORT     SURGEON:   Renette Butters, MD    ASSISTANT:  Joya Gaskins OPA     ANESTHESIA:  General    COMPLICATIONS:  None.     COMPONENTS:  Stryker acolade fit femur size 6 with a 36 mm -2.5 head ball and a PSL acetabular shell size 52 with a  polyethylene liner    PROCEDURE IN DETAIL:   The patient was met in the holding area and  identified.  The appropriate hip was identified and marked at the operative site.  The patient was then transported to the OR  and  placed under general anesthesia.  At that point, the patient was  placed in the supine position and  secured to the operating room table and all bony prominences padded. He received pre-operative antibiotics    The operative lower extremity was prepped from the iliac crest to the distal leg.  Sterile draping was performed.  Time out was performed prior to incision.      Skin incision was made just 3 cm distal to the ASIS and 3 cm posterior to it extending in line with the tensor fascia lata. Electrocautery was used to control all bleeders. I dissected down sharply to the fascia of the  tensor fascia lata was confirmed that the muscle fibers beneath were running posteriorly. I then incised the fascia over the superficial tensor fascia lata in line with the incision. The fascia was elevated off the anterior aspect of the muscle the muscle was retracted posteriorly and protected throughout the case. I then used electrocautery to incise the tensor fascia lata fascia control and all bleeders. Immediately visible was the fat over top of the anterior neck and capsule.  I removed the anterior fat from the capsule and elevated the rectus muscle off of the anterior capsule. I then removed a large time of capsule. The retractors were then placed over the anterior acetabulum as well as around the superior and inferior neck.  I then removed a section of the femoral neck and a napkin ring fashion. Then used the power course to remove the femoral head from the acetabulum and thoroughly irrigated the acetabulum. I sized the femoral head.    I then exposed the deep acetabulum, cleared out any tissue including the ligamentum teres.   After adequate visualization, I excised the labrum, and then sequentially reamed.  I placed the trial acetabulum, which seated nicely, and then impacted the real cup into place.  Appropriate version and inclination was confirmed clinically matching their bony anatomy, and also with the use transverse acetabular ligament.  I then abducted the leg and released the external rotators from the posterior femur allowing it to be easily delivered up lateral and anterior  to the acetabulum for preparation of the femoral canal.    I then prepared the proximal femur using the cookie-cutter and then sequentially reamed and broached.  A trial broach, neck, and head was utilized, and I reduced the hip and it was found to have excellent stability with functional range of motion. The trial components were then removed, and the real polyethylene liner was placed with the lip directed  posteriorly.  I then impacted the real femoral prosthesis into place into the appropriate version, slightly anteverted to the normal anatomy, and I impacted the real head ball into place. The hip was then reduced and taken through functional range of motion and found to have excellent stability. Leg lengths were restored.  I then irrigated the hip copiously again with, and repaired the fascia with Vicryl, followed by monocryl for the subcutaneous tissue, Monocryl for the skin, Steri-Strips and sterile gauze. The wounds were injected. The patient was then awakened and returned to PACU in stable and satisfactory condition. There were no complications.  POST OPERATIVE PLAN: WBAT, DVT px: SCD's/TED and ASA 325 daily  Edmonia Lynch, MD Orthopedic Surgeon [phone]   08/04/2013 12:02 PM

## 2013-08-04 NOTE — Interval H&P Note (Signed)
History and Physical Interval Note:  08/04/2013 8:33 AM  Mary [name]  has presented today for surgery, with the diagnosis of OA LEFT KNEE  The various methods of treatment have been discussed with the patient and family. After consideration of risks, benefits and other options for treatment, the patient has consented to  Procedure(s): LEFT TOTAL HIP ARTHROPLASTY ANTERIOR APPROACH (Left) as a surgical intervention .  The patient's history has been reviewed, patient examined, no change in status, stable for surgery.  I have reviewed the patient's chart and labs.  Questions were answered to the patient's satisfaction.     MURPHY, TIMOTHY, D

## 2013-08-04 NOTE — Anesthesia Postprocedure Evaluation (Signed)
Anesthesia Post Note  Patient: Mary [name]  Procedure(s) Performed: Procedure(s) (LRB): LEFT TOTAL HIP ARTHROPLASTY ANTERIOR APPROACH (Left)  Anesthesia type: general  Patient location: PACU  Post pain: Pain level controlled  Post assessment: Patient's Cardiovascular Status Stable  Last Vitals:  Filed Vitals:   08/04/13 1656  BP: 131/70  Pulse: 84  Temp: 36.7 C  Resp: 20    Post vital signs: Reviewed and stable  Level of consciousness: sedated  Complications: No apparent anesthesia complications

## 2013-08-05 LAB — BASIC METABOLIC PANEL
BUN: 24 mg/dL — AB (ref 6–23)
CHLORIDE: 99 meq/L (ref 96–112)
CO2: 22 meq/L (ref 19–32)
Calcium: 8.8 mg/dL (ref 8.4–10.5)
Creatinine, Ser: 1.27 mg/dL — ABNORMAL HIGH (ref 0.50–1.10)
GFR calc Af Amer: 43 mL/min — ABNORMAL LOW (ref >90)
GFR calc non Af Amer: 37 mL/min — ABNORMAL LOW (ref >90)
GLUCOSE: 175 mg/dL — AB (ref 70–99)
POTASSIUM: 5 meq/L (ref 3.7–5.3)
SODIUM: 136 meq/L — AB (ref 137–147)

## 2013-08-05 NOTE — Progress Notes (Signed)
I participated in the care of this patient and agree with the above history, physical and evaluation. I performed a review of the history and a physical exam as detailed   Timothy Daniel Murphy MD  

## 2013-08-05 NOTE — Clinical Social Work Psychosocial (Signed)
Clinical Social Work Department BRIEF PSYCHOSOCIAL ASSESSMENT 08/05/2013  Patient:  Mary [name], Mary [name]     Account Number:  [id number]     Admit date:  08/04/2013  Clinical Social Worker:  Daiva Huge  Date/Time:  08/05/2013 04:34 PM  Referred by:  Physician  Date Referred:  08/05/2013 Referred for  SNF Placement   Other Referral:   Interview type:  Patient Other interview type:   Daughter at bedside [phone]    PSYCHOSOCIAL DATA Living Status:  ALONE Admitted from facility:   Level of care:   Primary support name:  daughters Primary support relationship to patient:  FAMILY Degree of support available:   good    CURRENT CONCERNS Current Concerns  Post-Acute Placement   Other Concerns:    SOCIAL WORK ASSESSMENT / PLAN Patient open to SNF- she has pre-registered at [facility] and we anticipate dc Friday-   Assessment/plan status:  Other - See comment Other assessment/ plan:   Information/referral to community resources:   SNF list    PATIENT'S/FAMILY'S RESPONSE TO PLAN OF CARE: Patient and daughter are agreeable to SNF plans and appreciative of CSW assist.       Eduard Clos, MSW, [place] [phone]

## 2013-08-05 NOTE — Progress Notes (Signed)
Patient ID: Mary [name], female   DOB: 11-18-1925, 80 y.o.   MRN: [id number]     Subjective:  Patient reports pain as mild to moderate.  Patient sitting on the side of the bed after using the bedside toilet.  She states that the leg feels week.  Objective:   VITALS:   Filed Vitals:   08/04/13 1656 08/04/13 2042 08/05/13 0022 08/05/13 0522  BP: 131/70 153/76 131/59 136/68  Pulse: 84 99 84 80  Temp: 98.1 F (36.7 C) 97.2 F (36.2 C) 97.5 F (36.4 C) 97.3 F (36.3 C)  TempSrc: Oral Oral  Oral  Resp: 20 20 20 20   Height:      Weight:      SpO2: 100% 97% 100% 100%    ABD soft Sensation intact distally Dorsiflexion/Plantar flexion intact Incision: dressing C/D/I and no drainage   Lab Results  Component Value Date   WBC 10.5 07/31/2013   HGB 11.7* 07/31/2013   HCT 35.9* 07/31/2013   MCV 87.8 07/31/2013   PLT 255 07/31/2013     Assessment/Plan: 1 Day Post-Op   Active Problems:   DJD (degenerative joint disease)   Advance diet Up with therapy Plan for SNF when available WBAT Dry dressing PRN    Remonia Richter 08/05/2013, 7:53 AM   Edmonia Lynch MD [phone]

## 2013-08-05 NOTE — Progress Notes (Signed)
OT Cancellation Note  Patient Details Name: Mary [name] MRN: [id number] DOB: 13-Jan-1926   Cancelled Treatment:    Reason Eval/Treat Not Completed: Other (comment) (Pt going SNF. Deferring OT needs to next venue.)  Hortencia Pilar 08/05/2013, 1:11 PM

## 2013-08-05 NOTE — Clinical Social Work Placement (Addendum)
Clinical Social Work Department CLINICAL SOCIAL WORK PLACEMENT NOTE 08/05/2013  Patient:  Mary [name], Mary [name]  Account Number:  [id number] Admit date:  08/04/2013  Clinical Social Worker:  Daiva Huge  Date/time:  08/05/2013 04:44 PM  Clinical Social Work is seeking post-discharge placement for this patient at the following level of care:   SKILLED NURSING   (*CSW will update this form in Epic as items are completed)   08/05/2013  Patient/family provided with [facility] Department of Clinical Social Work's list of facilities offering this level of care within the geographic area requested by the patient (or if unable, by the patient's family).  08/05/2013  Patient/family informed of their freedom to choose among providers that offer the needed level of care, that participate in Medicare, Medicaid or managed care program needed by the patient, have an available bed and are willing to accept the patient.  08/05/2013  Patient/family informed of [facility]' ownership interest in [facility], as well as of the fact that they are under no obligation to receive care at this facility.  PASARR submitted to EDS on 08/05/2013 PASARR number received on 08/05/2013  FL2 transmitted to all facilities in geographic area requested by pt/family on  08/05/2013 FL2 transmitted to all facilities within larger geographic area on   Patient informed that his/her managed care company has contracts with or will negotiate with  certain facilities, including the following:     Patient/family informed of bed offers received:  08/05/13 Patient chooses bed at [facility] Physician recommends and patient chooses bed at    Patient to be transferred to [facility] on  08/07/13 Patient to be transferred to facility by PTAR Patient and family notified of transfer on  Name of family member notified:    The following physician request were entered in Epic:   Additional Comments: CSW  notified pt and daughter that [facility] is ready to take pt this morning. Daughter requested transport for 11am; RN in room and agreed. Discharge packet placed on chart. [facility]AR scheduled. CSW signing off.   Eduard Clos, MSW, [place]

## 2013-08-05 NOTE — Progress Notes (Signed)
Physical Therapy Treatment Patient Details Name: Mary [name] MRN: [id number] DOB: Jul 20, 1925 Today's Date: 08/05/2013    History of Present Illness Lt THA (direct anterior) 08/04/13    PT Comments    Pt slowly progressing with therapy. Pt demo increased difficulty with sit to stand from bed this session. Will cont to recommend SNF for post acute rehab due to lack of support at home. Will cont to follow per POC.   Follow Up Recommendations  SNF;Supervision/Assistance - 24 hour     Equipment Recommendations  Rolling walker with 5" wheels    Recommendations for Other Services       Precautions / Restrictions Precautions Precautions: Fall Restrictions Weight Bearing Restrictions: Yes LLE Weight Bearing: Weight bearing as tolerated    Mobility  Bed Mobility Overal bed mobility: Needs Assistance Bed Mobility: Supine to Sit;Sit to Supine     Supine to sit: Min assist;HOB elevated Sit to supine: Min assist   General bed mobility comments: (A) to advance Lt LE off and onto bed; pt requires incr time due to pain; relies on handrails; cues for sequencing  Transfers Overall transfer level: Needs assistance Equipment used: Rolling walker (2 wheeled) Transfers: Sit to/from Stand Sit to Stand: Mod assist;From elevated surface         General transfer comment: pt with incr difficulty achieving sit to stand this session; bed elevated and mod (A) to achieve; cues for sequencing and hand placement with RW   Ambulation/Gait Ambulation/Gait assistance: Min assist Ambulation Distance (Feet): 20 Feet Assistive device: Rolling walker (2 wheeled) Gait Pattern/deviations: Step-through pattern;Decreased stance time - left;Decreased step length - right;Wide base of support;Trunk flexed;Antalgic Gait velocity: very slow due to pain  Gait velocity interpretation: <1.8 ft/sec, indicative of risk for recurrent falls General Gait Details: verbal and visual cues for gt sequencing and  upright posture; (A) to maintain balance and manage RW    Stairs            Wheelchair Mobility    Modified Rankin (Stroke Patients Only)       Balance Overall balance assessment: Needs assistance Sitting-balance support: Feet supported;No upper extremity supported Sitting balance-Leahy Scale: Fair Sitting balance - Comments: performed exercises at EOB; no c/o dizziness   Standing balance support: During functional activity;Bilateral upper extremity supported Standing balance-Leahy Scale: Poor Standing balance comment: relies on RW for support                    Cognition Arousal/Alertness: Awake/alert Behavior During Therapy: WFL for tasks assessed/performed Overall Cognitive Status: Within Functional Limits for tasks assessed                      Exercises Total Joint Exercises Ankle Circles/Pumps: AROM;Strengthening;Both;10 reps;Supine Quad Sets: AROM;Strengthening;Left;10 reps;Seated Short Arc Quad: AAROM;Left;5 reps;Supine Heel Slides: AAROM;10 reps;Left;Strengthening;Supine Long Arc Quad: AAROM;Left;10 reps;Seated;Other (comment) (cues to slow down and control contraction )    General Comments        Pertinent Vitals/Pain C/o 4-5/10 pain; RN notified that pt is requesting pain meds. patient repositioned for comfort     Home Living Family/patient expects to be discharged to:: Skilled nursing facility Living Arrangements: Alone             Additional Comments: Pt lives alone; plans to D/C to [facility] for rehab    Prior Function Level of Independence: Independent          PT Goals (current goals can now be found in the care  plan section) Acute Rehab PT Goals Patient Stated Goal: to get rehab then home PT Goal Formulation: With patient Time For Goal Achievement: 08/12/13 Potential to Achieve Goals: Good Progress towards PT goals: Progressing toward goals    Frequency  7X/week    PT Plan Current plan remains appropriate     Co-evaluation             End of Session Equipment Utilized During Treatment: Gait belt Activity Tolerance: Patient tolerated treatment well Patient left: in bed;with call bell/phone within reach;with bed alarm set     Time: 2585-2778 PT Time Calculation (min): 25 min  Charges:  $Gait Training: 8-22 mins $Therapeutic Exercise: 8-22 mins                    G CodesGustavus Bryant, [place] [phone] 08/05/2013, 3:49 PM

## 2013-08-05 NOTE — Care Management Note (Signed)
CARE MANAGEMENT NOTE 08/05/2013  Patient:  Mary [name], Mary [name]   Account Number:  [id number]  Date Initiated:  08/05/2013  Documentation initiated by:  Ricki Miller  Subjective/Objective Assessment:   80 yr old female s/p left total hip, anterior approach.     Action/Plan:   Patient is for shortterm rehab at SNF, wants [facility]. Social worker is aware. Patient was preoperatively setup with [facility].   Anticipated DC Date:  08/07/2013   Anticipated DC Plan:  SKILLED NURSING FACILITY  In-house referral  Clinical Social Worker      DC Planning Services  CM consult      [facility] Choice  NA   Choice offered to / List presented to:     DME arranged  NA        [place] arranged  NA      Status of service:  Completed, signed off Medicare Important Message given?   (If response is "NO", the following Medicare IM given date fields will be blank) Date Medicare IM given:   Date Additional Medicare IM given:    Discharge Disposition:  [place]  Per UR Regulation:  Reviewed for med. necessity/level of care/duration of stay

## 2013-08-05 NOTE — Evaluation (Signed)
Physical Therapy Evaluation Patient Details Name: Mary [name] MRN: [id number] DOB: 1925-02-27 Today's Date: 08/05/2013   History of Present Illness  Lt THA (direct anterior) 08/04/13  Clinical Impression  Pt is s/p Lt THA POD # 1 resulting in the deficits listed below (see PT Problem List). Pt will benefit from skilled PT to increase their independence and safety with mobility to allow discharge to the venue listed below. Pt lives alone and plans to D/C to [facility] for post acute rehab.      Follow Up Recommendations SNF;Supervision/Assistance - 24 hour    Equipment Recommendations  Rolling walker with 5" wheels    Recommendations for Other Services       Precautions / Restrictions Precautions Precautions: Fall Restrictions Weight Bearing Restrictions: Yes LLE Weight Bearing: Weight bearing as tolerated      Mobility  Bed Mobility Overal bed mobility: Needs Assistance Bed Mobility: Supine to Sit     Supine to sit: Min assist     General bed mobility comments: (A) to advance Lt LE to/off EOB; cues for hand placement and sequencing   Transfers Overall transfer level: Needs assistance Equipment used: Rolling walker (2 wheeled) Transfers: Sit to/from Stand Sit to Stand: Min assist         General transfer comment: (A) to maintain balance and to acheive upright standing position; pt limited with amount she can WB through Lt LE due to pain; cues for hand placement and safety with RW   Ambulation/Gait Ambulation/Gait assistance: Min assist Ambulation Distance (Feet): 8 Feet Assistive device: Rolling walker (2 wheeled) Gait Pattern/deviations: Step-through pattern;Decreased stance time - left;Decreased step length - right;Antalgic;Trunk flexed;Narrow base of support Gait velocity: decreased Gait velocity interpretation: Below normal speed for age/gender General Gait Details: cues for gt sequencing and upright posture; pt required (A) to manage RW and maintain  balance  Stairs            Wheelchair Mobility    Modified Rankin (Stroke Patients Only)       Balance Overall balance assessment: Needs assistance Sitting-balance support: Feet supported;Single extremity supported;No upper extremity supported Sitting balance-Leahy Scale: Fair Sitting balance - Comments: no c/o dizziness sitting EOB   Standing balance support: During functional activity;Bilateral upper extremity supported Standing balance-Leahy Scale: Poor Standing balance comment: relies heavily on bil UE support from RW                              Pertinent Vitals/Pain 8/10 in Lt groin; patient repositioned for comfort     Home Living Family/patient expects to be discharged to:: Skilled nursing facility Living Arrangements: Alone               Additional Comments: Pt lives alone; plans to D/C to [facility] for rehab    Prior Function Level of Independence: Independent               Hand Dominance        Extremity/Trunk Assessment   Upper Extremity Assessment: Defer to OT evaluation           Lower Extremity Assessment: LLE deficits/detail   LLE Deficits / Details: knee 3-/5; limited by pain  Cervical / Trunk Assessment: Kyphotic  Communication   Communication: No difficulties  Cognition Arousal/Alertness: Awake/alert Behavior During Therapy: WFL for tasks assessed/performed Overall Cognitive Status: Within Functional Limits for tasks assessed  General Comments      Exercises Total Joint Exercises Ankle Circles/Pumps: AROM;Strengthening;Both;10 reps;Supine Quad Sets: AROM;Strengthening;Left;10 reps;Seated Long Arc Quad: AAROM;Left;10 reps;Seated      Assessment/Plan    PT Assessment Patient needs continued PT services  PT Diagnosis Difficulty walking;Generalized weakness;Acute pain   PT Problem List Decreased strength;Decreased range of motion;Decreased activity tolerance;Decreased  balance;Decreased mobility;Decreased knowledge of use of DME;Pain  PT Treatment Interventions DME instruction;Gait training;Functional mobility training;Therapeutic activities;Therapeutic exercise;Balance training;Neuromuscular re-education;Patient/family education   PT Goals (Current goals can be found in the Care Plan section) Acute Rehab PT Goals Patient Stated Goal: to get rehab then home PT Goal Formulation: With patient Time For Goal Achievement: 08/12/13 Potential to Achieve Goals: Good    Frequency 7X/week   Barriers to discharge Decreased caregiver support lives alone    Co-evaluation               End of Session Equipment Utilized During Treatment: Gait belt Activity Tolerance: Patient tolerated treatment well Patient left: in chair;with call bell/phone within reach;with family/visitor present Nurse Communication: Mobility status;Precautions;Weight bearing status         Time: 0815-0833 PT Time Calculation (min): 18 min   Charges:   PT Evaluation $Initial PT Evaluation Tier I: 1 Procedure PT Treatments $Gait Training: 8-22 mins   PT G CodesGustavus Bryant, [place] 08/05/2013, 9:41 AM

## 2013-08-06 ENCOUNTER — Encounter (HOSPITAL_COMMUNITY): Payer: Self-pay | Admitting: Orthopedic Surgery

## 2013-08-06 NOTE — Discharge Summary (Signed)
Physician Discharge Summary  Patient ID: Mary [name] MRN: [id number] DOB/AGE: 1925/03/16 80 y.o.  Admit date: 08/04/2013 Discharge date: 08/06/2013  Admission Diagnoses:  <principal problem not specified>  Discharge Diagnoses:  Active Problems:   DJD (degenerative joint disease)   Past Medical History  Diagnosis Date  . Arthritis   . Thyroid disease   . Hypertension   . Hypothyroidism   . Peripheral vascular disease   . Frequency of urination   . Cancer     skin cancer    Surgeries: Procedure(s): LEFT TOTAL HIP ARTHROPLASTY ANTERIOR APPROACH on 08/04/2013   Consultants (if any):    Discharged Condition: Improved  Hospital Course: Mary [name] is an 80 y.o. female who was admitted 08/04/2013 with a diagnosis of <principal problem not specified> and went to the operating room on 08/04/2013 and underwent the above named procedures.    She was given perioperative antibiotics:  Anti-infectives   Start     Dose/Rate Route Frequency Ordered Stop   08/04/13 1630  ceFAZolin (ANCEF) IVPB 2 g/50 mL premix  Status:  Discontinued     2 g 100 mL/hr over 30 Minutes Intravenous Every 6 hours 08/04/13 1501 08/04/13 1528   08/04/13 1630  ceFAZolin (ANCEF) IVPB 2 g/50 mL premix  Status:  Discontinued     2 g 100 mL/hr over 30 Minutes Intravenous 3 times per day 08/04/13 1528 08/05/13 1402   08/04/13 0600  ceFAZolin (ANCEF) IVPB 2 g/50 mL premix     2 g 100 mL/hr over 30 Minutes Intravenous On call to O.R. 08/03/13 1246 08/04/13 1005    .  She was given sequential compression devices, early ambulation, and ASA 325 for DVT prophylaxis.  She benefited maximally from the hospital stay and there were no complications.    Recent vital signs:  Filed Vitals:   08/06/13 1431  BP: 111/50  Pulse: 65  Temp: 97.6 F (36.4 C)  Resp: 18    Recent laboratory studies:  Lab Results  Component Value Date   HGB 11.7* 07/31/2013   HGB 11.6* 12/07/2011   Lab Results  Component  Value Date   WBC 10.5 07/31/2013   PLT 255 07/31/2013   Lab Results  Component Value Date   INR 0.97 07/31/2013   Lab Results  Component Value Date   NA 136* 08/05/2013   K 5.0 08/05/2013   CL 99 08/05/2013   CO2 22 08/05/2013   BUN 24* 08/05/2013   CREATININE 1.27* 08/05/2013   GLUCOSE 175* 08/05/2013    Discharge Medications:     Medication List         acetaminophen 500 MG tablet  Commonly known as:  TYLENOL  Take 500 mg by mouth every 6 (six) hours as needed for moderate pain.     aspirin EC 325 MG tablet  Take 1 tablet (325 mg total) by mouth daily.     atorvastatin 20 MG tablet  Commonly known as:  LIPITOR  Take 10 mg by mouth daily.     CALCIUM SOFT CHEWS [phone] MG-UNT-MCG Chew  Generic drug:  Calcium-Vitamin D-Vitamin K  Chew 1 tablet by mouth daily.     celecoxib 200 MG capsule  Commonly known as:  CELEBREX  Take 1 capsule (200 mg total) by mouth 2 (two) times daily.     docusate sodium 100 MG capsule  Commonly known as:  COLACE  Take 1 capsule (100 mg total) by mouth 2 (two) times daily. Continue this while taking narcotics  to help with bowel movements     ergocalciferol 50000 UNITS capsule  Commonly known as:  VITAMIN D2  Take 50,000 Units by mouth See admin instructions. Every other week     furosemide 20 MG tablet  Commonly known as:  LASIX  Take 10-20 mg by mouth daily. Take 20 mg every other day alternating with 10 mg     gabapentin 300 MG capsule  Commonly known as:  NEURONTIN  Take 300 mg by mouth daily as needed (pain).     HYDROcodone-acetaminophen 5-325 MG per tablet  Commonly known as:  NORCO  Take 1-2 tablets by mouth every 4 (four) hours as needed for moderate pain.     levothyroxine 50 MCG tablet  Commonly known as:  SYNTHROID, LEVOTHROID  Take 50 mcg by mouth daily before breakfast.     losartan 100 MG tablet  Commonly known as:  COZAAR  Take 100 mg by mouth daily.     methocarbamol 500 MG tablet  Commonly known as:  ROBAXIN   Take 1 tablet (500 mg total) by mouth every 6 (six) hours as needed.     omega-3 acid ethyl esters 1 G capsule  Commonly known as:  LOVAZA  Take 1 g by mouth daily.     ondansetron 4 MG tablet  Commonly known as:  ZOFRAN  Take 1 tablet (4 mg total) by mouth every 8 (eight) hours as needed for nausea.     psyllium 0.52 G capsule  Commonly known as:  REGULOID  Take 0.52 g by mouth daily as needed (constipation).     verapamil 240 MG (CO) 24 hr tablet  Commonly known as:  COVERA HS  Take 240 mg by mouth at bedtime.        Diagnostic Studies: Dg Pelvis Portable  08/04/2013   CLINICAL DATA:  Postoperative evaluation.  EXAM: PORTABLE PELVIS 1-2 VIEWS  COMPARISON:  None.  FINDINGS: Patient status post left hip arthroplasty. No evidence for associated acute fracture. Soft tissue gas compatible with postoperative state. Lower lumbar spine degenerative change.  IMPRESSION: Status post left hip arthroplasty.   Electronically Signed   By: Lovey Newcomer M.D.   On: 08/04/2013 13:38    Disposition: Final discharge disposition not confirmed      Discharge Instructions   Weight bearing as tolerated    Complete by:  As directed            Follow-up Information   Follow up with Renette Butters, MD. (as scheduled)    Specialty:  Orthopedic Surgery   Contact information:   [address] [place] [address] [phone]        Signed: Edmonia Lynch, D 08/06/2013, 4:30 PM

## 2013-08-06 NOTE — Progress Notes (Signed)
Physical Therapy Treatment Patient Details Name: Mary [name] MRN: [id number] DOB: 1926-01-23 Today's Date: 08/06/2013    History of Present Illness Lt THA (direct anterior) 08/04/13    PT Comments    Pt limited with mobility today secondary to increased pain and fatigue. Pt continues to be highly motivated but requires min (A) for short distance mobility and for bed mobility. Pt will cont to require SNF for post acute rehab due to decreased caregiver support. Will cont to follow per POC.   Follow Up Recommendations  SNF;Supervision/Assistance - 24 hour     Equipment Recommendations  Rolling walker with 5" wheels    Recommendations for Other Services       Precautions / Restrictions Precautions Precautions: Fall Restrictions Weight Bearing Restrictions: Yes LLE Weight Bearing: Weight bearing as tolerated    Mobility  Bed Mobility Overal bed mobility: Needs Assistance Bed Mobility: Sit to Supine       Sit to supine: Min assist   General bed mobility comments: (A) to advance Lt LE into bed; pt required incr time due to incr pain; cues for sequencing   Transfers Overall transfer level: Needs assistance Equipment used: Rolling walker (2 wheeled) Transfers: Sit to/from Stand Sit to Stand: Min assist;From elevated surface         General transfer comment: (A) to maintain balance and achieve upright standing position from [facility]; incr cues for hand placement and to control descent to bed with sitting transfer   Ambulation/Gait Ambulation/Gait assistance: Min assist Ambulation Distance (Feet): 12 Feet Assistive device: Rolling walker (2 wheeled) Gait Pattern/deviations: Decreased stance time - left;Decreased step length - right;Decreased stride length;Narrow base of support;Trunk flexed;Antalgic;Step-to pattern Gait velocity: very slow due to pain  Gait velocity interpretation: Below normal speed for age/gender General Gait Details: pt limited in mobility today  secondary to pain; has difficulty clearing Lt LE due to weakness; cues for upright posture and proper gt sequencing; (A) to manage RW and maintain balance    Stairs            Wheelchair Mobility    Modified Rankin (Stroke Patients Only)       Balance Overall balance assessment: Needs assistance Sitting-balance support: Feet supported;No upper extremity supported Sitting balance-Leahy Scale: Good Sitting balance - Comments: perofrmed LAQ EOB prior to returning to supine    Standing balance support: During functional activity;Bilateral upper extremity supported Standing balance-Leahy Scale: Poor Standing balance comment: relies heavily on UE support                     Cognition Arousal/Alertness: Awake/alert Behavior During Therapy: WFL for tasks assessed/performed Overall Cognitive Status: Within Functional Limits for tasks assessed                      Exercises Total Joint Exercises Ankle Circles/Pumps: AROM;Strengthening;Both;10 reps;Supine Quad Sets: AROM;Strengthening;Left;10 reps;Seated Heel Slides: AAROM;Left;Supine;Other (comment);10 reps (c/o pain and muscle spasms at completion) Hip ABduction/ADduction: Left;Strengthening;10 reps;AAROM;Supine    General Comments        Pertinent Vitals/Pain 8/10 c/o "cramping pain"; RN made aware     Home Living                      Prior Function            PT Goals (current goals can now be found in the care plan section) Acute Rehab PT Goals Patient Stated Goal: to get rehab then home PT Goal Formulation:  With patient Time For Goal Achievement: 08/12/13 Potential to Achieve Goals: Good Progress towards PT goals: Progressing toward goals    Frequency  7X/week    PT Plan Current plan remains appropriate    Co-evaluation             End of Session Equipment Utilized During Treatment: Gait belt Activity Tolerance: Patient limited by fatigue;Patient limited by pain Patient  left: in bed;with call bell/phone within reach;with family/visitor present     Time: 7322-0254 PT Time Calculation (min): 24 min  Charges:  $Gait Training: 8-22 mins $Therapeutic Exercise: 8-22 mins                    G CodesElie Confer [place], [place] 08/06/2013, 3:01 PM

## 2013-08-07 ENCOUNTER — Other Ambulatory Visit: Payer: Self-pay | Admitting: *Deleted

## 2013-08-07 MED ORDER — HYDROCODONE-ACETAMINOPHEN 5-325 MG PO TABS
1.0000 | ORAL_TABLET | ORAL | Status: DC | PRN
Start: 2013-08-07 — End: 2014-10-27

## 2013-08-07 NOTE — Telephone Encounter (Signed)
[facility] 

## 2013-08-07 NOTE — Progress Notes (Signed)
Physical Therapy Treatment Patient Details Name: Mary [name] MRN: [id number] DOB: 18-Dec-1925 Today's Date: 08/07/2013    History of Present Illness Lt THA (direct anterior) 08/04/13    PT Comments    Pt making progress.  Continue to recommend SNF for ongoing Physical Therapy.       Follow Up Recommendations  SNF;Supervision/Assistance - 24 hour     Equipment Recommendations  Rolling walker with 5" wheels    Recommendations for Other Services       Precautions / Restrictions Precautions Precautions: Fall Restrictions Weight Bearing Restrictions: Yes LLE Weight Bearing: Weight bearing as tolerated    Mobility  Bed Mobility               General bed mobility comments: Pt sitting in recliner upon PT arrival  Transfers Overall transfer level: Needs assistance Equipment used: Rolling walker (2 wheeled) Transfers: Sit to/from Stand Sit to Stand: Min assist            Ambulation/Gait Ambulation/Gait assistance: Min assist Ambulation Distance (Feet): 18 Feet Assistive device: Rolling walker (2 wheeled) Gait Pattern/deviations: Step-to pattern;Decreased stride length. Pt has one loss of balance when she had what she described as a muscle spasm in LLE.  Gait velocity: very slow due to pain and difficulty clearing LLE from floor during swing phase Gait velocity interpretation: Below normal speed for age/gender     Stairs            Wheelchair Mobility    Modified Rankin (Stroke Patients Only)       Balance     Sitting balance-Leahy Scale: Good       Standing balance-Leahy Scale: Poor                      Cognition Arousal/Alertness: Awake/alert Behavior During Therapy: WFL for tasks assessed/performed Overall Cognitive Status: Within Functional Limits for tasks assessed                      Exercises Total Joint Exercises Ankle Circles/Pumps: AROM;Both;10 reps;Supine Quad Sets: AROM;Strengthening;Left;10 reps;Seated  (in recliner for all exercises) Heel Slides: AAROM;Left;Supine;Other (comment);10 reps Hip ABduction/ADduction: Left;Strengthening;10 reps;AAROM;Supine Straight Leg Raises: AAROM;Left;5 reps;Supine Long Arc Quad: AAROM;Strengthening;Both;10 reps;Seated    General Comments General comments (skin integrity, edema, etc.): Pt plans for DC today to SNF for continued rehab      Pertinent Vitals/Pain 4/10 pain in left hip.     Home Living                      Prior Function            PT Goals (current goals can now be found in the care plan section) Acute Rehab PT Goals Patient Stated Goal: to get rehab then home PT Goal Formulation: With patient Time For Goal Achievement: 08/12/13 Potential to Achieve Goals: Good Progress towards PT goals: Progressing toward goals    Frequency  7X/week    PT Plan Current plan remains appropriate    Co-evaluation             End of Session Equipment Utilized During Treatment: Gait belt Activity Tolerance: Patient tolerated treatment well Patient left: with call bell/phone within reach;in chair     Time: 1696-7893 PT Time Calculation (min): 32 min  Charges:  $Gait Training: 8-22 mins $Therapeutic Exercise: 8-22 mins  G Codes:      Melvern Banker 2013/08/15, 11:20 AM Lavonia Dana, PT  [phone] 08/15/13

## 2013-08-07 NOTE — Progress Notes (Signed)
Patient ID: Mary [name], female   DOB: 1926-01-28, 80 y.o.   MRN: [id number]     Subjective:  Patient reports pain as mild.  Patient states that she rested better last night and pain is better controlled.   Objective:   VITALS:   Filed Vitals:   08/06/13 0615 08/06/13 1431 08/06/13 2039 08/07/13 0544  BP: 113/44 111/50 121/60 112/63  Pulse: 62 65 75 80  Temp: 98.3 F (36.8 C) 97.6 F (36.4 C) 97.8 F (36.6 C) 97.5 F (36.4 C)  TempSrc:      Resp: 20 18 18 18   Height:      Weight:      SpO2: 91% 99% 100% 98%    ABD soft Sensation intact distally Dorsiflexion/Plantar flexion intact Incision: dressing C/D/I and no drainage   Lab Results  Component Value Date   WBC 10.5 07/31/2013   HGB 11.7* 07/31/2013   HCT 35.9* 07/31/2013   MCV 87.8 07/31/2013   PLT 255 07/31/2013     Assessment/Plan: 3 Days Post-Op   Active Problems:   DJD (degenerative joint disease)   Advance diet Up with therapy Discharge to SNF WBAT Dry dressing PRN   Remonia Richter 08/07/2013, 7:01 AM   Edmonia Lynch MD [phone]

## 2013-08-07 NOTE — Discharge Planning (Signed)
Report called to [facility] at [facility].

## 2013-08-10 ENCOUNTER — Other Ambulatory Visit: Payer: Self-pay | Admitting: *Deleted

## 2013-08-10 MED ORDER — TRAMADOL HCL 50 MG PO TABS: ORAL_TABLET | ORAL | Status: DC

## 2013-08-10 NOTE — Progress Notes (Signed)
I participated in the care of this patient and agree with the above history, physical and evaluation. I performed a review of the history and a physical exam as detailed   Timothy Daniel Murphy MD  

## 2013-08-10 NOTE — Telephone Encounter (Signed)
[facility] 

## 2013-08-11 ENCOUNTER — Non-Acute Institutional Stay (SKILLED_NURSING_FACILITY): Payer: Medicare Other | Admitting: Adult Health

## 2013-08-11 ENCOUNTER — Encounter: Payer: Self-pay | Admitting: Adult Health

## 2013-08-11 DIAGNOSIS — M1610 Unilateral primary osteoarthritis, unspecified hip: Secondary | ICD-10-CM

## 2013-08-11 DIAGNOSIS — K5900 Constipation, unspecified: Secondary | ICD-10-CM

## 2013-08-11 DIAGNOSIS — E785 Hyperlipidemia, unspecified: Secondary | ICD-10-CM

## 2013-08-11 DIAGNOSIS — M1612 Unilateral primary osteoarthritis, left hip: Secondary | ICD-10-CM

## 2013-08-11 DIAGNOSIS — D62 Acute posthemorrhagic anemia: Secondary | ICD-10-CM

## 2013-08-11 DIAGNOSIS — I10 Essential (primary) hypertension: Secondary | ICD-10-CM

## 2013-08-11 DIAGNOSIS — E039 Hypothyroidism, unspecified: Secondary | ICD-10-CM | POA: Insufficient documentation

## 2013-08-11 NOTE — Progress Notes (Signed)
Patient ID: Mary [name], female   DOB: [birth year], 80 y.o.   MRN: [id number]               PROGRESS NOTE  DATE: 08/11/2013  FACILITY: Nursing Home Location: [facility]  LEVEL OF CARE: SNF (31)  Acute Visit  CHIEF COMPLAINT:  Follow-up Hospitalization  HISTORY OF PRESENT ILLNESS:  This is an 80 year old female who has been admitted to [facility] on 08/07/13 from [facility] with DJD S/P Left total hip arthroplasty. She has been admitted for a short-term rehabilitation.  REASSESSMENT OF ONGOING PROBLEM(S):  HTN: Pt 's HTN remains stable.  Denies CP, sob, DOE, pedal edema, headaches, dizziness or visual disturbances.  No complications from the medications currently being used.  Last BP : 109/49  ANEMIA: The anemia has been stable. The patient denies fatigue, melena or hematochezia. No complications from the medications currently being used. 6/15 hgb 11.7  CONSTIPATION: The constipation remains stable. No complications from the medications presently being used. Patient denies ongoing constipation, abdominal pain, nausea or vomiting.   PAST MEDICAL HISTORY : Reviewed.  No changes/see problem list  CURRENT MEDICATIONS: Reviewed per MAR/see medication list  REVIEW OF SYSTEMS:  GENERAL: no change in appetite, no fatigue, no weight changes, no fever, chills or weakness RESPIRATORY: no cough, SOB, DOE, wheezing, hemoptysis CARDIAC: no chest pain,  or palpitations, +edema GI: no abdominal pain, diarrhea, constipation, heart burn, nausea or vomiting  PHYSICAL EXAMINATION  GENERAL: no acute distress, normal body habitus EYES: conjunctivae normal, sclerae normal, normal eye lids NECK: supple, trachea midline, no neck masses, no thyroid tenderness, no thyromegaly LYMPHATICS: no LAN in the neck, no supraclavicular LAN RESPIRATORY: breathing is even & unlabored, BS CTAB CARDIAC: RRR, no murmur,no extra heart sounds, LLE edema GI: abdomen soft, normal BS, no  masses, no tenderness, no hepatomegaly, no splenomegaly EXTREMITIES: able to move all 4 extremities; has limited ROM on LLE due to surgery PSYCHIATRIC: the patient is alert & oriented to person, affect & behavior appropriate  LABS/RADIOLOGY: Labs reviewed: Basic Metabolic Panel:  Recent Labs  07/31/13 1246 08/05/13 0507  NA 138 136*  K 4.3 5.0  CL 97 99  CO2 24 22  GLUCOSE 97 175*  BUN 27* 24*  CREATININE 1.23* 1.27*  CALCIUM 10.1 8.8   CBC:  Recent Labs  07/31/13 1246  WBC 10.5  HGB 11.7*  HCT 35.9*  MCV 87.8  PLT 255    ASSESSMENT/PLAN:  DJD status post left total hip arthroplasty - for rehabilitation; start Tramadol 50 mg 1-2 tabs PO Q 6 hours PRN; ted hose, thigh high, on in am and off @ hs Hyperlipidemia - continue Atorvastatin and Lovaza Constipation - continue Colace and I MiraLax Hypothyroidism - continue Synthroid Hypertension - well controlled; continue losartan and Verapamil; check BMP Anemia, acute blood loss - check CBC   CPT CODE: 09470  Monina Vargas - NP [facility] [phone]

## 2013-08-12 LAB — CBC AND DIFFERENTIAL
HEMATOCRIT: 26 % — AB (ref 36–46)
Hemoglobin: 7.9 g/dL — AB (ref 12.0–16.0)
PLATELETS: 343 10*3/uL (ref 150–399)
WBC: 13.2 10*3/mL

## 2013-08-18 ENCOUNTER — Non-Acute Institutional Stay (SKILLED_NURSING_FACILITY): Payer: Medicare Other | Admitting: Internal Medicine

## 2013-08-18 DIAGNOSIS — E039 Hypothyroidism, unspecified: Secondary | ICD-10-CM

## 2013-08-18 DIAGNOSIS — M1612 Unilateral primary osteoarthritis, left hip: Secondary | ICD-10-CM

## 2013-08-18 DIAGNOSIS — I10 Essential (primary) hypertension: Secondary | ICD-10-CM

## 2013-08-18 DIAGNOSIS — M1610 Unilateral primary osteoarthritis, unspecified hip: Secondary | ICD-10-CM

## 2013-08-18 DIAGNOSIS — D62 Acute posthemorrhagic anemia: Secondary | ICD-10-CM

## 2013-08-21 DIAGNOSIS — E039 Hypothyroidism, unspecified: Secondary | ICD-10-CM | POA: Insufficient documentation

## 2013-08-21 DIAGNOSIS — M1612 Unilateral primary osteoarthritis, left hip: Secondary | ICD-10-CM | POA: Insufficient documentation

## 2013-08-21 NOTE — Progress Notes (Signed)
HISTORY & PHYSICAL  DATE: 08/18/2013   FACILITY: [facility]  LEVEL OF CARE: SNF (31)  ALLERGIES:  No Known Allergies  CHIEF COMPLAINT:  Manage left hip osteoarthritis, hypothyroidism and hypertension  HISTORY OF PRESENT ILLNESS: 80 year old Caucasian female.  HIP OSTEOARTHRITIS: patient had advanced end stage OA of the hip with progressively worsening pain & dysfunction.  Pt failed non-surgical conservative management.  Therefore pt underwent total hip arthroplasty & tolerated the procedure well.  Pt denies hip pain currently.  Pt was admitted to this facility for short term rehabilitation.  HTN: Pt 's HTN remains stable.  Denies CP, sob, DOE, headaches, dizziness or visual disturbances.  No complications from the medications currently being used.  Last BP : 119/72.  HYPOTHYROIDISM: The hypothyroidism remains stable. No complications noted from the medications presently being used.  The patient denies fatigue or constipation.  Last TSH not available.  PAST MEDICAL HISTORY :  Past Medical History  Diagnosis Date  . Arthritis   . Thyroid disease   . Hypertension   . Hypothyroidism   . Peripheral vascular disease   . Frequency of urination   . Cancer     skin cancer    PAST SURGICAL HISTORY: Past Surgical History  Procedure Laterality Date  . Tonsillectomy    . Eye surgery Bilateral bilateral  . Total hip arthroplasty Left 08/04/2013    Procedure: LEFT TOTAL HIP ARTHROPLASTY ANTERIOR APPROACH;  Surgeon: Renette Butters, MD;  Location: [facility];  Service: Orthopedics;  Laterality: Left;    SOCIAL HISTORY:  reports that she has never smoked. She does not have any smokeless tobacco history on file. She reports that she does not drink alcohol or use illicit drugs.  FAMILY HISTORY:  Family History  Problem Relation Age of Onset  . Stroke Mother   . Cancer Father     CURRENT MEDICATIONS: Reviewed per MAR/see medication list  REVIEW OF  SYSTEMS:  See HPI otherwise 14 point ROS is negative.  PHYSICAL EXAMINATION  VS:  See VS section  GENERAL: no acute distress, moderately obese body habitus EYES: conjunctivae normal, sclerae normal, normal eye lids MOUTH/THROAT: lips without lesions,no lesions in the mouth,tongue is without lesions,uvula elevates in midline NECK: supple, trachea midline, no neck masses, no thyroid tenderness, no thyromegaly LYMPHATICS: no LAN in the neck, no supraclavicular LAN RESPIRATORY: breathing is even & unlabored, BS CTAB CARDIAC: RRR, no murmur,no extra heart sounds, +2 bilateral lower extremity edema GI:  ABDOMEN: abdomen soft, normal BS, no masses, no tenderness  LIVER/SPLEEN: no hepatomegaly, no splenomegaly MUSCULOSKELETAL: HEAD: normal to inspection  EXTREMITIES: LEFT UPPER EXTREMITY: full range of motion, normal strength & tone RIGHT UPPER EXTREMITY:  full range of motion, normal strength & tone LEFT LOWER EXTREMITY:   range of motion not tested due to surgery, normal strength & tone RIGHT LOWER EXTREMITY:  Moderate range of motion, normal strength & tone PSYCHIATRIC: the patient is alert & oriented to person, affect & behavior appropriate  LABS/RADIOLOGY:  Labs reviewed: Basic Metabolic Panel:  Recent Labs  07/31/13 1246 08/05/13 0507  NA 138 136*  K 4.3 5.0  CL 97 99  CO2 24 22  GLUCOSE 97 175*  BUN 27* 24*  CREATININE 1.23* 1.27*  CALCIUM 10.1 8.8   CBC:  Recent Labs  07/31/13 1246  WBC 10.5  HGB 11.7*  HCT 35.9*  MCV 87.8  PLT 255    PORTABLE PELVIS 1-2 VIEWS  COMPARISON:  None.   FINDINGS: Patient status post left hip arthroplasty. No evidence for associated acute fracture. Soft tissue gas compatible with postoperative state. Lower lumbar spine degenerative change.   IMPRESSION: Status post left hip arthroplasty   ASSESSMENT/PLAN:  Left hip osteoarthritis-status post total hip arthroplasty. Continue  rehabilitation. Hypertension-well-controlled Hypothyroidism-continue levothyroxine. Acute blood loss anemia-check hemoglobin level Constipation-continue Colace Hyperglycemia-recheck Renal insufficiency-recheck Check CBC and BMP  I have reviewed patient's medical records received at admission/from hospitalization.  CPT CODE: 83374  Gayani Y Dasanayaka, [place] [phone]

## 2013-08-24 LAB — BASIC METABOLIC PANEL
BUN: 27 mg/dL — AB (ref 4–21)
Glucose: 107 mg/dL
POTASSIUM: 4.7 mmol/L (ref 3.4–5.3)
Sodium: 140 mmol/L (ref 137–147)

## 2013-08-28 NOTE — OR Nursing (Signed)
Late entry for type, subtype and infection under procedural recordings 

## 2013-08-29 ENCOUNTER — Emergency Department (HOSPITAL_COMMUNITY)
Admission: EM | Admit: 2013-08-29 | Discharge: 2013-08-29 | Disposition: A | Discharge status: Home / Self Care | Payer: Medicare Other | Attending: Emergency Medicine | Admitting: Emergency Medicine

## 2013-08-29 ENCOUNTER — Emergency Department (HOSPITAL_COMMUNITY): Payer: Medicare Other

## 2013-08-29 ENCOUNTER — Encounter (HOSPITAL_COMMUNITY): Payer: Self-pay | Admitting: Emergency Medicine

## 2013-08-29 DIAGNOSIS — Z8639 Personal history of other endocrine, nutritional and metabolic disease: Secondary | ICD-10-CM | POA: Diagnosis not present

## 2013-08-29 DIAGNOSIS — S99919A Unspecified injury of unspecified ankle, initial encounter: Secondary | ICD-10-CM | POA: Diagnosis not present

## 2013-08-29 DIAGNOSIS — Y92129 Unspecified place in nursing home as the place of occurrence of the external cause: Secondary | ICD-10-CM

## 2013-08-29 DIAGNOSIS — S8990XA Unspecified injury of unspecified lower leg, initial encounter: Secondary | ICD-10-CM | POA: Insufficient documentation

## 2013-08-29 DIAGNOSIS — Z791 Long term (current) use of non-steroidal anti-inflammatories (NSAID): Secondary | ICD-10-CM | POA: Diagnosis not present

## 2013-08-29 DIAGNOSIS — Z85828 Personal history of other malignant neoplasm of skin: Secondary | ICD-10-CM | POA: Diagnosis not present

## 2013-08-29 DIAGNOSIS — M129 Arthropathy, unspecified: Secondary | ICD-10-CM | POA: Diagnosis not present

## 2013-08-29 DIAGNOSIS — S99929A Unspecified injury of unspecified foot, initial encounter: Principal | ICD-10-CM

## 2013-08-29 DIAGNOSIS — Z79899 Other long term (current) drug therapy: Secondary | ICD-10-CM | POA: Diagnosis not present

## 2013-08-29 DIAGNOSIS — Y9210 Unspecified residential institution as the place of occurrence of the external cause: Secondary | ICD-10-CM | POA: Insufficient documentation

## 2013-08-29 DIAGNOSIS — M79605 Pain in left leg: Secondary | ICD-10-CM

## 2013-08-29 DIAGNOSIS — Z862 Personal history of diseases of the blood and blood-forming organs and certain disorders involving the immune mechanism: Secondary | ICD-10-CM | POA: Diagnosis not present

## 2013-08-29 DIAGNOSIS — W010XXA Fall on same level from slipping, tripping and stumbling without subsequent striking against object, initial encounter: Secondary | ICD-10-CM | POA: Insufficient documentation

## 2013-08-29 DIAGNOSIS — Y9389 Activity, other specified: Secondary | ICD-10-CM | POA: Insufficient documentation

## 2013-08-29 DIAGNOSIS — W19XXXA Unspecified fall, initial encounter: Secondary | ICD-10-CM

## 2013-08-29 DIAGNOSIS — Z7982 Long term (current) use of aspirin: Secondary | ICD-10-CM | POA: Diagnosis not present

## 2013-08-29 DIAGNOSIS — I10 Essential (primary) hypertension: Secondary | ICD-10-CM | POA: Insufficient documentation

## 2013-08-29 NOTE — ED Notes (Signed)
Pt is at [facility] for rehab from a previous left hip surgery. Pt was using a stair-stepper, when her left leg gave out. Pt had x-rays this AM, which showed a possible left fibular fx. Pt denies any increased pain,. Except with ambulation. Pt's leg is tender with palpation.

## 2013-08-29 NOTE — ED Notes (Signed)
Pt just left with [facility] for transport

## 2013-08-29 NOTE — ED Provider Notes (Signed)
CSN: [id number]     Arrival date & time 08/29/13  0044 History   First MD Initiated Contact with Patient 08/29/13 0133     Chief Complaint  Patient presents with  . Fall     (Consider location/radiation/quality/duration/timing/severity/associated sxs/prior Treatment) HPI 80 year old female presents to emergency apartment with report of abnormal x-ray after a fall.  Patient reports that she had a fall today while in physical therapy.  Patient is currently in a rehabilitation facility after a left hip arthroplasty.  She reports that she has difficulties lifting her left leg out.  She reports that after going up a 3 stare incline with physical therapy, she turned to come back down and stumbled and fell.  She did not strike her head.  This happen before lunch.  After that time, patient was in her wheelchair for the rest of the day.  She is reporting some pain with standing in her left lower leg.  She had x-rays done at her facility that questioned whether she had a fibular fracture. Past Medical History  Diagnosis Date  . Arthritis   . Thyroid disease   . Hypertension   . Hypothyroidism   . Peripheral vascular disease   . Frequency of urination   . Cancer     skin cancer   Past Surgical History  Procedure Laterality Date  . Tonsillectomy    . Eye surgery Bilateral bilateral  . Total hip arthroplasty Left 08/04/2013    Procedure: LEFT TOTAL HIP ARTHROPLASTY ANTERIOR APPROACH;  Surgeon: Renette Butters, MD;  Location: [facility] [facility];  Service: Orthopedics;  Laterality: Left;   Family History  Problem Relation Age of Onset  . Stroke Mother   . Cancer Father    History  Substance Use Topics  . Smoking status: Never Smoker   . Smokeless tobacco: Not on file  . Alcohol Use: No   OB History   Grav Para Term Preterm Abortions TAB SAB Ect Mult Living                 Review of Systems  See History of Present Illness; otherwise all other systems are reviewed and negative   Allergies   Review of patient's allergies indicates no known allergies.  Home Medications   Prior to Admission medications   Medication Sig Start Date End Date Taking? Authorizing Provider  acetaminophen (TYLENOL) 500 MG tablet Take 500 mg by mouth every 6 (six) hours as needed for moderate pain.    Historical Provider, MD  aspirin EC 325 MG tablet Take 1 tablet (325 mg total) by mouth daily. 08/04/13   Renette Butters, MD  atorvastatin (LIPITOR) 20 MG tablet Take 10 mg by mouth daily.    Historical Provider, MD  Calcium-Vitamin D-Vitamin K (CALCIUM SOFT CHEWS) 500-100-40 MG-UNT-MCG CHEW Chew 1 tablet by mouth daily.    Historical Provider, MD  celecoxib (CELEBREX) 200 MG capsule Take 1 capsule (200 mg total) by mouth 2 (two) times daily. 08/04/13   Renette Butters, MD  docusate sodium (COLACE) 100 MG capsule Take 1 capsule (100 mg total) by mouth 2 (two) times daily. Continue this while taking narcotics to help with bowel movements 08/04/13   Renette Butters, MD  ergocalciferol (VITAMIN D2) 50000 UNITS capsule Take 50,000 Units by mouth See admin instructions. Every other week    Historical Provider, MD  furosemide (LASIX) 20 MG tablet Take 10-20 mg by mouth daily. Take 20 mg every other day alternating with 10 mg  Historical Provider, MD  gabapentin (NEURONTIN) 300 MG capsule Take 300 mg by mouth daily as needed (pain).     Historical Provider, MD  HYDROcodone-acetaminophen (NORCO) 5-325 MG per tablet Take 1-2 tablets by mouth every 4 (four) hours as needed for moderate pain. 08/07/13   Gerlene Fee, NP  levothyroxine (SYNTHROID, LEVOTHROID) 50 MCG tablet Take 50 mcg by mouth daily before breakfast.    Historical Provider, MD  losartan (COZAAR) 100 MG tablet Take 100 mg by mouth daily.    Historical Provider, MD  methocarbamol (ROBAXIN) 500 MG tablet Take 1 tablet (500 mg total) by mouth every 6 (six) hours as needed. 08/04/13   Renette Butters, MD  omega-3 acid ethyl esters (LOVAZA) 1 G capsule Take  1 g by mouth daily.    Historical Provider, MD  ondansetron (ZOFRAN) 4 MG tablet Take 1 tablet (4 mg total) by mouth every 8 (eight) hours as needed for nausea. 08/04/13   Renette Butters, MD  psyllium (REGULOID) 0.52 G capsule Take 0.52 g by mouth daily as needed (constipation).    Historical Provider, MD  traMADol (ULTRAM) 50 MG tablet Take one tablet by mouth every 6 hours as needed for mild to moderate pain; Take two tablets by mouth every 6 hours as needed for severe pain 08/10/13   Tiffany L Reed, DO  verapamil (COVERA HS) 240 MG (CO) 24 hr tablet Take 240 mg by mouth at bedtime.    Historical Provider, MD   BP 93/48  Pulse 78  Temp(Src) 98.6 F (37 C) (Oral)  Resp 16  Ht 5\' 3"  (1.6 m)  SpO2 95% Physical Exam  Nursing note and vitals reviewed. Constitutional: She is oriented to person, place, and time. She appears well-developed and well-nourished.  HENT:  Head: Normocephalic and atraumatic.  Right Ear: External ear normal.  Left Ear: External ear normal.  Nose: Nose normal.  Mouth/Throat: Oropharynx is clear and moist.  Eyes: Conjunctivae and EOM are normal. Pupils are equal, round, and reactive to light.  Neck: Normal range of motion. Neck supple. No JVD present. No tracheal deviation present. No thyromegaly present.  Cardiovascular: Normal rate, normal heart sounds and intact distal pulses.  Exam reveals no gallop and no friction rub.   No murmur heard. Frequent PVCs.   Pulmonary/Chest: Effort normal and breath sounds normal. No stridor. No respiratory distress. She has no wheezes. She has no rales. She exhibits no tenderness.  Abdominal: Soft. Bowel sounds are normal. She exhibits no distension and no mass. There is no tenderness. There is no rebound and no guarding.  Musculoskeletal: Normal range of motion. She exhibits edema (bilateral lower leg edema.  Diffuse tenderness to palpation in both legs.  She has limited range of motion of the left knee). She exhibits no tenderness.   Lymphadenopathy:    She has no cervical adenopathy.  Neurological: She is alert and oriented to person, place, and time. She has normal reflexes. No cranial nerve deficit. She exhibits normal muscle tone. Coordination normal.  Skin: Skin is warm and dry. No rash noted. No erythema. No pallor.  Psychiatric: She has a normal mood and affect. Her behavior is normal. Judgment and thought content normal.    ED Course  Procedures (including critical care time) Labs Review Labs Reviewed - No data to display  Imaging Review Dg Tibia/fibula Left  08/29/2013   CLINICAL DATA:  Possible fibular fracture.  EXAM: LEFT TIBIA AND FIBULA - 2 VIEW  COMPARISON:  None.  FINDINGS: Slight cortical irregularity of the fibular head seen only on the lateral radiograph. No dislocation. No destructive bony lesions. Pretibial soft tissue swelling without subcutaneous gas or radiopaque foreign bodies.  IMPRESSION: Slight cortical irregularity of fibular head seen only on the lateral radiograph, recommend correlation with point tenderness.  Pretibial soft tissue swelling.   Electronically Signed   By: Elon Alas   On: 08/29/2013 03:27     EKG Interpretation   Date/Time:  Saturday August 29 2013 01:03:53 EDT Ventricular Rate:  86 PR Interval:    QRS Duration: 96 QT Interval:  603 QTC Calculation: 721 R Axis:   19 Text Interpretation:  Normal sinus rhythm with frequent Premature  ventricular complexes Low voltage, precordial leads Prolonged QT interval  No old tracing to compare Confirmed by Lei Dower  MD, Hanne Kegg ([id number]) on  08/29/2013 4:25:36 AM      MDM   Final diagnoses:  Fall at nursing home, initial encounter  Leg pain, diffuse, left   80 year old female status post fall earlier today.  X-rays here without fracture, cortical irregularity, but no specific tenderness over this area.  Patient is diffusely tender to palpation.  Plan to discharge back to her facility followup with orthopedist.    Kalman Drape, MD 08/29/13 [phone]

## 2013-08-29 NOTE — Discharge Instructions (Signed)
No fracture was seen on your xrays today.  Please continue physical therapy.  Follow up with your orthopedist.   Fall Prevention in Hospitals As a hospital patient, your condition and the treatments you receive can increase your risk for falls. Some additional risk factors for falls in a hospital include:  Being in an unfamiliar environment.  Being on bed rest.  Your surgery.  Taking certain medicines.  Your tubing requirements, such as intravenous (IV) therapy or catheters. It is important that you learn how to decrease fall risks while at the hospital. Below are important tips that can help prevent falls. SAFETY TIPS FOR PREVENTING FALLS Talk about your risk of falling.  Ask your caregiver why you are at risk for falling. Is it your medicine, illness, tubing placement, or something else?  Make a plan with your caregiver to keep you safe from falls.  Ask your caregiver or pharmacist about side effect of your medicines. Some medicines can make you dizzy or affect your coordination. Ask for help.  Ask for help before getting out of bed. You may need to press your call button.  Ask for assistance in getting you safely to the toilet.  Ask for a walker or cane to be put at your bedside. Ask that most of the side rails on your bed be placed up before your caregiver leaves the room.  Ask family or friends to sit with you.  Ask for things that are out of your reach, such as your glasses, hearing aids, telephone, bedside table, or call button. Follow these tips to avoid falling:  Stay lying or seated, rather than standing, while waiting for help.  Wear rubber-soled slippers or shoes whenever you walk in the hospital.  Avoid quick, sudden movements.  Change positions slowly.  Sit on the side of your bed before standing.  Stand up slowly and wait before you start to walk.  Let your caregiver know if there is a spill on the floor.  Pay careful attention to the medical  equipment, electrical cords, and tubes around you.  When you need help, use your call button by your bed or in the bathroom. Wait for one of your caregivers to help you.  If you feel dizzy or unsure of your footing, return to bed and wait for assistance.  Avoid being distracted by the TV, telephone, or another person in your room.  Do not lean or support yourself on rolling objects, such as IV poles or bedside tables. Document Released: 01/27/2000 Document Revised: 01/16/2012 Document Reviewed: 10/07/2011 [facility] Patient Information 2015 [place], [place]. This information is not intended to replace advice given to you by your health care provider. Make sure you discuss any questions you have with your health care provider.  Musculoskeletal Pain Musculoskeletal pain is muscle and boney aches and pains. These pains can occur in any part of the body. Your caregiver may treat you without knowing the cause of the pain. They may treat you if blood or urine tests, X-rays, and other tests were normal.  CAUSES There is often not a definite cause or reason for these pains. These pains may be caused by a type of germ (virus). The discomfort may also come from overuse. Overuse includes working out too hard when your body is not fit. Boney aches also come from weather changes. Bone is sensitive to atmospheric pressure changes. HOME CARE INSTRUCTIONS   Ask when your test results will be ready. Make sure you get your test results.  Only  take over-the-counter or prescription medicines for pain, discomfort, or fever as directed by your caregiver. If you were given medications for your condition, do not drive, operate machinery or power tools, or sign legal documents for 24 hours. Do not drink alcohol. Do not take sleeping pills or other medications that may interfere with treatment.  Continue all activities unless the activities cause more pain. When the pain lessens, slowly resume normal activities. Gradually  increase the intensity and duration of the activities or exercise.  During periods of severe pain, bed rest may be helpful. Lay or sit in any position that is comfortable.  Putting ice on the injured area.  Put ice in a bag.  Place a towel between your skin and the bag.  Leave the ice on for 15 to 20 minutes, 3 to 4 times a day.  Follow up with your caregiver for continued problems and no reason can be found for the pain. If the pain becomes worse or does not go away, it may be necessary to repeat tests or do additional testing. Your caregiver may need to look further for a possible cause. SEEK IMMEDIATE MEDICAL CARE IF:  You have pain that is getting worse and is not relieved by medications.  You develop chest pain that is associated with shortness or breath, sweating, feeling sick to your stomach (nauseous), or throw up (vomit).  Your pain becomes localized to the abdomen.  You develop any new symptoms that seem different or that concern you. MAKE SURE YOU:   Understand these instructions.  Will watch your condition.  Will get help right away if you are not doing well or get worse. Document Released: 01/29/2005 Document Revised: 04/23/2011 Document Reviewed: 10/03/2012 [facility] Patient Information 2015 [place], [place]. This information is not intended to replace advice given to you by your health care provider. Make sure you discuss any questions you have with your health care provider.

## 2013-09-14 ENCOUNTER — Encounter: Payer: Self-pay | Admitting: Adult Health

## 2013-09-14 NOTE — Progress Notes (Signed)
This encounter was created in error - please disregard.

## 2013-09-15 ENCOUNTER — Non-Acute Institutional Stay (SKILLED_NURSING_FACILITY): Payer: Medicare Other | Admitting: Adult Health

## 2013-09-15 ENCOUNTER — Encounter: Payer: Self-pay | Admitting: Adult Health

## 2013-09-15 DIAGNOSIS — G629 Polyneuropathy, unspecified: Secondary | ICD-10-CM

## 2013-09-15 DIAGNOSIS — E039 Hypothyroidism, unspecified: Secondary | ICD-10-CM

## 2013-09-15 DIAGNOSIS — I10 Essential (primary) hypertension: Secondary | ICD-10-CM

## 2013-09-15 DIAGNOSIS — K5900 Constipation, unspecified: Secondary | ICD-10-CM

## 2013-09-15 DIAGNOSIS — S82402D Unspecified fracture of shaft of left fibula, subsequent encounter for closed fracture with routine healing: Secondary | ICD-10-CM

## 2013-09-15 DIAGNOSIS — M1610 Unilateral primary osteoarthritis, unspecified hip: Secondary | ICD-10-CM

## 2013-09-15 DIAGNOSIS — G609 Hereditary and idiopathic neuropathy, unspecified: Secondary | ICD-10-CM

## 2013-09-15 DIAGNOSIS — D62 Acute posthemorrhagic anemia: Secondary | ICD-10-CM

## 2013-09-15 DIAGNOSIS — S82402A Unspecified fracture of shaft of left fibula, initial encounter for closed fracture: Secondary | ICD-10-CM | POA: Insufficient documentation

## 2013-09-15 DIAGNOSIS — E785 Hyperlipidemia, unspecified: Secondary | ICD-10-CM

## 2013-09-15 DIAGNOSIS — M1612 Unilateral primary osteoarthritis, left hip: Secondary | ICD-10-CM

## 2013-09-15 DIAGNOSIS — S8290XD Unspecified fracture of unspecified lower leg, subsequent encounter for closed fracture with routine healing: Secondary | ICD-10-CM

## 2013-09-15 NOTE — Progress Notes (Signed)
Patient ID: Mary [name], female   DOB: [birth year], 80 y.o.   MRN: [id number]               PROGRESS NOTE  DATE:  09/15/13  FACILITY: Nursing Home Location: [facility]  LEVEL OF CARE: SNF (31)  Acute Visit  CHIEF COMPLAINT:  Follow-up Hospitalization  HISTORY OF PRESENT ILLNESS:  This is an 80 year old female who is for discharge home with Home health PT, OT, Nursing and Home health aide. DME: wheelchair 18" X 16" with desk arms, wheelchair cushion. She has been admitted to [facility] on 08/07/13 from [facility] with DJD S/P Left total hip arthroplasty. She has, also, left prox fib fracture. Patient was admitted to this facility for short-term rehabilitation after the patient's recent hospitalization.  Patient has completed SNF rehabilitation and therapy has cleared the patient for discharge.   REASSESSMENT OF ONGOING PROBLEM(S):  HTN: Pt 's HTN remains stable.  Denies CP, sob, DOE, pedal edema, headaches, dizziness or visual disturbances.  No complications from the medications currently being used.  Last BP : 126/54  PERIPHERAL NEUROPATHY: The peripheral neuropathy is stable. The patient denies pain in the feet, tingling, and numbness. No complications noted from the medication presently being used.  HYPERLIPIDEMIA: No complications from the medications presently being used. Last fasting lipid panel showed : is not available  PAST MEDICAL HISTORY : Reviewed.  No changes/see problem list  CURRENT MEDICATIONS: Reviewed per MAR/see medication list  REVIEW OF SYSTEMS:  GENERAL: no change in appetite, no fatigue, no weight changes, no fever, chills or weakness RESPIRATORY: no cough, SOB, DOE, wheezing, hemoptysis CARDIAC: no chest pain,  or palpitations, +edema GI: no abdominal pain, diarrhea, constipation, heart burn, nausea or vomiting  PHYSICAL EXAMINATION  GENERAL: no acute distress, obese NECK: supple, trachea midline, no neck masses, no thyroid  tenderness, no thyromegaly LYMPHATICS: no LAN in the neck, no supraclavicular LAN RESPIRATORY: breathing is even & unlabored, BS CTAB CARDIAC: RRR, no murmur,no extra heart sounds, BLE edema 2+ GI: abdomen soft, normal BS, no masses, no tenderness, no hepatomegaly, no splenomegaly EXTREMITIES: able to move all 4 extremities PSYCHIATRIC: the patient is alert & oriented to person, affect & behavior appropriate  LABS/RADIOLOGY: 08/24/13  7 creatinine 1.3 calcium 8.3s Labs reviewed: Basic Metabolic Panel:  Recent Labs  07/31/13 1246 08/05/13 0507 08/24/13  NA 138 136* 140  K 4.3 5.0 4.7  CL 97 99  --   CO2 24 22  --   GLUCOSE 97 175*  --   BUN 27* 24* 27*  CREATININE 1.23* 1.27*  --   CALCIUM 10.1 8.8  --    CBC:  Recent Labs  07/31/13 1246 08/12/13  WBC 10.5 13.2  HGB 11.7* 7.9*  HCT 35.9* 26*  MCV 87.8  --   PLT 255 343   EXAM: PORTABLE PELVIS 1-2 VIEWS   COMPARISON:  None.   FINDINGS: Patient status post left hip arthroplasty. No evidence for associated acute fracture. Soft tissue gas compatible with postoperative state. Lower lumbar spine degenerative change.   IMPRESSION: Status post left hip arthroplasty. EXAM: LEFT TIBIA AND FIBULA - 2 VIEW   COMPARISON:  None.   FINDINGS: Slight cortical irregularity of the fibular head seen only on the lateral radiograph. No dislocation. No destructive bony lesions. Pretibial soft tissue swelling without subcutaneous gas or radiopaque foreign bodies.   IMPRESSION: Slight cortical irregularity of fibular head seen only on the lateral radiograph, recommend correlation with  point tenderness.   Pretibial soft tissue swelling.      ASSESSMENT/PLAN:  Osteoarthritis status post left total hip arthroplasty - for home health PT, OT, nursing and home health aide  Left Prox Fib Fracture - for Home health therapy Hyperlipidemia - continue Atorvastatin and Lovaza Constipation - continue Colace and   MiraLax Hypothyroidism - continue Synthroid Hypertension - well controlled; continue losartan and Verapamil Anemia, acute blood loss - check CBC Peripheral neuropathy - continue Neurontin   I have filled out patient's discharge paperwork and written prescriptions.  Patient will receive home health PT, OT, Nursing and Home health aide.  DME provided: wheelchair 18" X 16" with desk arms, wheelchair cushion.  Total discharge time: Greater than 30 minutes  Discharge time involved coordination of the discharge process with social worker, nursing staff and therapy department. Medical justification for home health services/DME verified.   CPT CODE: 46803  Seth Bake - NP [facility] [phone]

## 2014-04-09 ENCOUNTER — Other Ambulatory Visit (HOSPITAL_COMMUNITY): Payer: Self-pay | Admitting: Otolaryngology

## 2014-04-09 DIAGNOSIS — C031 Malignant neoplasm of lower gum: Secondary | ICD-10-CM

## 2014-04-15 ENCOUNTER — Encounter (HOSPITAL_COMMUNITY)
Admission: RE | Admit: 2014-04-15 | Discharge: 2014-04-15 | Disposition: A | Discharge status: Home / Self Care | Payer: Medicare Other | Source: Ambulatory Visit | Attending: Otolaryngology | Admitting: Otolaryngology

## 2014-04-15 ENCOUNTER — Encounter (HOSPITAL_COMMUNITY): Payer: Self-pay

## 2014-04-15 ENCOUNTER — Ambulatory Visit (HOSPITAL_COMMUNITY)
Admission: RE | Admit: 2014-04-15 | Discharge: 2014-04-15 | Disposition: A | Discharge status: Home / Self Care | Payer: Medicare Other | Source: Ambulatory Visit | Attending: Otolaryngology | Admitting: Otolaryngology

## 2014-04-15 DIAGNOSIS — C031 Malignant neoplasm of lower gum: Secondary | ICD-10-CM | POA: Diagnosis present

## 2014-04-15 DIAGNOSIS — Z79899 Other long term (current) drug therapy: Secondary | ICD-10-CM | POA: Diagnosis not present

## 2014-04-15 LAB — POCT I-STAT, CHEM 8
BUN: 20 mg/dL (ref 6–23)
CHLORIDE: 103 mmol/L (ref 96–112)
Calcium, Ion: 1.23 mmol/L (ref 1.13–1.30)
Creatinine, Ser: 1.3 mg/dL — ABNORMAL HIGH (ref 0.50–1.10)
GLUCOSE: 94 mg/dL (ref 70–99)
HCT: 35 % — ABNORMAL LOW (ref 36.0–46.0)
Hemoglobin: 11.9 g/dL — ABNORMAL LOW (ref 12.0–15.0)
Potassium: 4.3 mmol/L (ref 3.5–5.1)
Sodium: 140 mmol/L (ref 135–145)
TCO2: 24 mmol/L (ref 0–100)

## 2014-04-15 LAB — GLUCOSE, CAPILLARY: Glucose-Capillary: 96 mg/dL (ref 70–99)

## 2014-04-15 MED ORDER — FLUDEOXYGLUCOSE F - 18 (FDG) INJECTION
8.9000 | Freq: Once | INTRAVENOUS | Status: AC | PRN
  Administered 2014-04-15: 8.9 via INTRAVENOUS

## 2014-04-15 MED ORDER — IOHEXOL 300 MG/ML  SOLN
100.0000 mL | Freq: Once | INTRAMUSCULAR | Status: AC | PRN
  Administered 2014-04-15: 100 mL via INTRAVENOUS

## 2014-04-19 NOTE — Progress Notes (Signed)
Head and Neck Cancer Location of Tumor / Histology: malignant neoplasm of right lower gum  Patient presented with an irritated area for 1-2 weeks in the right side of her mouth.  She went to the dentist who referred her to an oral surgery who referred her to Dr. Janace Hoard.  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 []  [x]    Swallowing concerns? []  [x]    PEG? []  [x]     Referrals Yes No Comments  Social Work? []  [x]    Dentistry? []  [x]    Swallowing therapy? []  [x]    Nutrition? []  [x]    Med/Onc? []  [x]     Safety Issues Yes No Comments  Prior radiation? [x]  []    Pacemaker/ICD? [x]  []    Possible current pregnancy? []  [x]    Is the patient on methotrexate? []  [x]     Tobacco/Marijuana/Snuff/ETOH use: used smokeless tobacco for at least 20 years, does not use ETOH or other drugs  Past/Anticipated interventions by otolaryngology, if any: biopsy 03/24/14  Past/Anticipated interventions by medical oncology, if any: unknown  Current Complaints / other details:  Patient is here with her daughter.  She reports pain in her right jaw at a 5/10.  She is taking tylenol 500 mg 3-4 times a day.  She reports trouble chewing on the right side of her mouth.  She denies trouble swallowing.  She does have a swollen area on her right lower jaw line.  BP 137/63 mmHg  Pulse 76  Temp(Src) 97.9 F (36.6 C) (Oral)  Resp 16  Ht 5\' 3"  (1.6 m)  Wt 171 lb 14.4 oz (77.973 kg)  BMI 30.46 kg/m2  SpO2 100%

## 2014-04-21 ENCOUNTER — Ambulatory Visit
Admission: RE | Admit: 2014-04-21 | Discharge: 2014-04-21 | Disposition: A | Discharge status: Home / Self Care | Payer: Medicare Other | Source: Ambulatory Visit | Attending: Radiation Oncology | Admitting: Radiation Oncology

## 2014-04-21 ENCOUNTER — Encounter: Payer: Self-pay | Admitting: Radiation Oncology

## 2014-04-21 VITALS — BP 137/63 | HR 76 | Temp 97.9°F | Resp 16 | Ht 63.0 in | Wt 171.9 lb

## 2014-04-21 DIAGNOSIS — C060 Malignant neoplasm of cheek mucosa: Secondary | ICD-10-CM

## 2014-04-21 DIAGNOSIS — C411 Malignant neoplasm of mandible: Secondary | ICD-10-CM | POA: Diagnosis present

## 2014-04-21 DIAGNOSIS — C770 Secondary and unspecified malignant neoplasm of lymph nodes of head, face and neck: Secondary | ICD-10-CM | POA: Diagnosis not present

## 2014-04-21 DIAGNOSIS — C039 Malignant neoplasm of gum, unspecified: Secondary | ICD-10-CM

## 2014-04-21 NOTE — Progress Notes (Signed)
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 female who is seen out courtesy of Dr. Janace Hoard for an opinion concerning radiation therapy as part of management of the patient's recently diagnosed locally advanced squamous cell carcinoma presenting in the right alveolar mandibular ridge/buccal mucosal region.  She has a prior history of a small superficially invasive well-differentiated squamous cell carcinoma presenting in this general area in 2013 patient underwent surgery by Dr. Laurena Slimmer (oral surgery). Recently the patient is been having discomfort in her right lower mandibular area along her molar region. She was seen by her dentist and lesion was noted in this area. It reportedly a x-ray showed bone loss in this immediate area. The patient was referred to Dr Dr. Sabra Heck and then to Dr. Janace Hoard. The patient was noted to have a lesion along the areolar ridge/buccal mucosal region. A biopsy of this area revealed invasive well-differentiated keratinizing squamous cell carcinoma. Patient proceeded to undergo CT scan of the neck as well as PET scan. On CT scan the patient was noted to have a soft tissue mass along the buccal surface of the right mandibular alveolar ridge which measured 3.4 x 1.4 cm. Patient was also noted to have a 1.5 x 1.2 cm right level IB lymph node a subsequent PET scan showed hypermetabolic activity along the right mandible with activity noted laterally as well as medially. The lymph node of concern showed hypermetabolic activity. Given the probable bone involvement surgical intervention was recommended by  Dr. Janace Hoard. The patient is now seen in radiation oncology for further evaluation as it relates to her locally advanced squamous cell carcinoma.  PREVIOUS RADIATION THERAPY: No  PAST MEDICAL HISTORY:  has a past medical history of Arthritis; Thyroid disease; Hypertension; Hypothyroidism; Peripheral vascular disease; Frequency of urination; and Cancer.    PAST SURGICAL HISTORY: Past Surgical History  Procedure Laterality Date  . Tonsillectomy    . Eye surgery Bilateral bilateral  . Total hip arthroplasty Left 08/04/2013    Procedure: LEFT TOTAL HIP ARTHROPLASTY ANTERIOR APPROACH;  Surgeon: Renette Butters, MD;  Location: [place];  Service: Orthopedics;  Laterality: Left;    FAMILY HISTORY: family history includes Cancer in her brother; Lung cancer in her father; Stroke in her mother.  SOCIAL HISTORY:  reports that she has never smoked. She has quit using smokeless tobacco. She reports that she does not drink alcohol or use illicit drugs. the patient did dip snuff but has not done so in approximately 20 years.  ALLERGIES: Review of patient's allergies indicates no known allergies.  MEDICATIONS:  Current Outpatient Prescriptions  Medication Sig Dispense Refill  . acetaminophen (TYLENOL) 500 MG tablet Take 500 mg by mouth every 6 (six) hours as needed for moderate pain.    Marland Kitchen aspirin EC 325 MG tablet Take 1 tablet (325 mg total) by mouth daily. (Patient taking differently: Take 81 mg by mouth daily. ) 30 tablet 0  . atorvastatin (LIPITOR) 10 MG tablet Take 10 mg by mouth daily.    . Calcium-Vitamin D-Vitamin K (VIACTIV PO) Take by mouth daily.    . ergocalciferol (VITAMIN D2) 50000 UNITS capsule Take  50,000 Units by mouth See admin instructions. Every other week    . furosemide (LASIX) 20 MG tablet Take 10-20 mg by mouth daily. Take 20 mg every other day alternating with 10 mg    . gabapentin (NEURONTIN) 300 MG capsule Take 300 mg by mouth daily as needed (pain).     Marland Kitchen levothyroxine (SYNTHROID,  LEVOTHROID) 50 MCG tablet Take 50 mcg by mouth daily before breakfast.    . losartan (COZAAR) 100 MG tablet Take 100 mg by mouth daily.    . Multiple Vitamin (MULTIVITAMIN) capsule Take 1 capsule by mouth daily.    Marland Kitchen omega-3 acid ethyl esters (LOVAZA) 1 G capsule Take 1 g by mouth daily.    . psyllium (REGULOID) 0.52 G capsule Take 0.52 g by mouth daily as needed (constipation).    . verapamil (COVERA HS) 240 MG (CO) 24 hr tablet Take 240 mg by mouth at bedtime.    . calcium-vitamin D (OSCAL WITH D) 500-200 MG-UNIT per tablet Take 1 tablet by mouth daily with breakfast.    . docusate sodium (COLACE) 100 MG capsule Take 1 capsule (100 mg total) by mouth 2 (two) times daily. Continue this while taking narcotics to help with bowel movements (Patient not taking: Reported on 04/21/2014) 30 capsule 1  . HYDROcodone-acetaminophen (NORCO) 5-325 MG per tablet Take 1-2 tablets by mouth every 4 (four) hours as needed for moderate pain. (Patient not taking: Reported on 04/21/2014) 180 tablet 0  . methocarbamol (ROBAXIN) 500 MG tablet Take 1 tablet (500 mg total) by mouth every 6 (six) hours as needed. (Patient not taking: Reported on 04/21/2014) 40 tablet 1  . ondansetron (ZOFRAN) 4 MG tablet Take 1 tablet (4 mg total) by mouth every 8 (eight) hours as needed for nausea. (Patient not taking: Reported on 04/21/2014) 60 tablet 0  . traMADol (ULTRAM) 50 MG tablet Take one tablet by mouth every 6 hours as needed for mild to moderate pain; Take two tablets by mouth every 6 hours as needed for severe pain (Patient not taking: Reported on 04/21/2014) 240 tablet 5   No current facility-administered medications for this encounter.    REVIEW OF SYSTEMS:  A 15 point review of systems is documented in the electronic medical record. This was obtained by the nursing staff. However, I reviewed this with the patient to discuss relevant findings and make appropriate changes.  She has pain with eating along the area of concern. The patient  also has had some pain radiates into her right year. She removes her hearing aid at night in light of this discomfort. Patient denies any significant bleeding from the area. She does feel her molar tooth is loose in this area. Patient denies any cough or breathing problems. Patient is still recovering from a left hip replacement last June. She ambulates with the assistance of a 4 pronged cane. Otherwise the patient has been very independent up until this most recent surgery.  she does live by herself. She is accompanied by one of her daughters.   PHYSICAL EXAM:  height is 5\' 3"  (1.6 m) and weight is 171 lb 14.4 oz (77.973 kg). Her oral temperature is 97.9 F (36.6 C). Her blood pressure is 137/63 and her pulse is 76. Her respiration is 16 and oxygen saturation is 100%.   BP 137/63 mmHg  Pulse 76  Temp(Src) 97.9 F (36.6 C) (Oral)  Resp 16  Ht 5\' 3"  (1.6 m)  Wt 171 lb 14.4 oz (77.973 kg)  BMI 30.46  kg/m2  SpO2 100%  General Appearance:    Alert, cooperative, no distress, appears stated age, obvious swelling noted along the right lower face   Head:    Normocephalic, without obvious abnormality, atraumatic  Eyes:    PERRL, conjunctiva/corneas clear, EOM's intact,       Ears:    hearing aids in both ears   Nose:   Nares normal, septum midline, mucosa normal, no drainage    or sinus tenderness  Throat:   Lips, mucosa, and tongue normal; ulcerative lesion noted along the right lateral mandibular alveolar ridge adjacent to her first molar tooth. The patient is quite uncomfortable with palpation in this area. The lesion extends up into the right buccal mucosa. There is no erosion through the face. palpation this is estimated to be approximately 3.5 x 4 cm in size. There is additional swelling superior to the lesion along the right face. There is leukoplakia changes and some erythema along the medial alveolar ridge suspicious for extension into this region.   Neck:   Supple, symmetrical, trachea midline,  level IB  Right lymph node barely palpable , just inferior to the primary site;    thyroid:  no enlargement/tenderness/nodules; no carotid   bruit or JVD  Back:     Symmetric, no curvature, ROM normal, no CVA tenderness  Lungs:     Clear to auscultation bilaterally, respirations unlabored  Chest Wall:    No tenderness or deformity   Heart:    Regular rate and rhythm, S1 and S2 normal, no murmur, rub   or gallop     Abdomen:     Soft, non-tender, bowel sounds active all four quadrants,    no masses, no organomegaly        Extremities:   Extremities normal, atraumatic, no cyanosis or edema  Pulses:   2+ and symmetric all extremities  Skin:   Skin color, texture, turgor normal, no rashes or lesions  Lymph nodes:    supraclavicular, and axillary nodes normal  Neurologic:   CNII-XII intact, normal strength, sensation and reflexes    throughout     ECOG = 1   1 - Symptomatic but completely ambulatory (Restricted in physically strenuous activity but ambulatory and able to carry out work of a light or sedentary nature. For example, light housework, office work) LABORATORY DATA:  Lab Results  Component Value Date   WBC 13.2 08/12/2013   HGB 11.9* 04/15/2014   HCT 35.0* 04/15/2014   MCV 87.8 07/31/2013   PLT 343 08/12/2013   Lab Results  Component Value Date   NA 140 04/15/2014   K 4.3 04/15/2014   CL 103 04/15/2014   CO2 22 08/05/2013   GLUCOSE 94 04/15/2014   CREATININE 1.30* 04/15/2014   CALCIUM 8.8 08/05/2013      RADIOGRAPHY: Ct Soft Tissue Neck W Contrast  04/15/2014   CLINICAL DATA:  Malignant neoplasm of right lower gum, diagnosed 02/2014.  EXAM: CT NECK WITH CONTRAST  TECHNIQUE: Multidetector CT imaging of the neck was performed using the standard protocol following the bolus administration of intravenous contrast.  CONTRAST:  122mL OMNIPAQUE IOHEXOL 300 MG/ML  SOLN  COMPARISON:  PET-CT earlier today  FINDINGS: Pharynx and larynx: The nasopharynx, oropharynx, and larynx  are unremarkable. There is a soft tissue mass along the buccal surface of the right mandibular alveolar ridge which measures approximately 3.3 x 1.4 cm and corresponds to the hypermetabolism on head CT. The superior portion of the mass is partially obscured by  metallic dental streak artifact. There is also asymmetric, thin soft tissue extending along the lingual surface of the right mandible which measures up to 5 mm in thickness. There is no definite osseous erosion of the adjacent mandible. Mild-to-moderate periapical lucency is noted involving the left maxillary first molar.  Salivary glands: Submandibular and parotid glands are unremarkable.  Thyroid: A 5 mm right thyroid nodule is incidentally noted.  Lymph nodes: Enlarged, hypermetabolic right level IB lymph node measures 1.5 x 1.2 cm. No enlarged lymph nodes are identified elsewhere in the neck.  Vascular: Major vascular structures of the neck appear patent. There is asymmetric atherosclerotic calcification involving the proximal right internal carotid artery resulting in approximately 50% stenosis.  Limited intracranial: The visualized portion of the brain is unremarkable.  Visualized orbits: Prior bilateral cataract extraction.  Mastoids and visualized paranasal sinuses: Clear.  Skeleton: Advanced multilevel disc and facet degeneration are present throughout the cervical spine there is fusion across the C4-5 disc space and left facet joint. No lytic or blastic osseous lesions are identified.  Upper chest: Minimal scarring is noted in the lung apices.  IMPRESSION: Right-sided oral cavity mass consistent with known malignancy. Enlarged right level IB lymph node consistent with nodal metastasis.   Electronically Signed   By: Logan Bores   On: 04/15/2014 16:51   Nm Pet Image Initial (pi) Skull Base To Thigh  04/15/2014   CLINICAL DATA:  Subsequent treatment strategy for head neck cancer. Malignant neoplasm of lower gum  EXAM: NUCLEAR MEDICINE PET SKULL BASE  TO THIGH  TECHNIQUE: 8.9 MCi F-18 FDG was injected intravenously. Full-ring PET imaging was performed from the skull base to thigh after the radiotracer. CT data was obtained and used for attenuation correction and anatomic localization.  FASTING BLOOD GLUCOSE:  Value: 96 mg/dl  COMPARISON:  None.  FINDINGS: NECK  There is hypermetabolic activity along the body of the left mandible. This activity is lateral and medial to the alveolar ridge (images 24). These activity is intense with SUV max 11.2. There is soft tissue thickening lateral to the mandible measuring 14 mm (image 29 series 4). There is a single hypermetabolic level 1 lymph node anterior to the right submandibular gland measuring 12 mm (image 32, series 4 )with SUV max the 6.5. No additional hypermetabolic lymph nodes within the left or right.  CHEST  No hypermetabolic mediastinal or hilar nodes. No suspicious pulmonary nodules on the CT scan.  ABDOMEN/PELVIS  No abnormal hypermetabolic activity within the liver, pancreas, adrenal glands, or spleen. No hypermetabolic lymph nodes in the abdomen or pelvis. There is no continuous peripheral calcification of the gallbladder (so-called porcelain gallbladder). No abnormal metabolic activity.  SKELETON  No focal hypermetabolic activity to suggest skeletal metastasis.  IMPRESSION: 1. Hypermetabolic thickening along the right mandible / alveolar ridge along the lateral and medial surfaces. 2. Metastatic lymph node to a right level 1 lymph node. 3. No cervical metastatic nodal disease. 4. No distant metastatic disease. 5. Porcelain gallbladder   Electronically Signed   By: Suzy Bouchard M.D.   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 Her clinical exam and x-ray findings patient and all likelihood has bone involvement. Given the location of the patient's lesion and bone involvement lesion would be very difficult to  cure with radiation therapy alone. My first recommendation would be for the patient to proceed with surgery she will likely require  postoperative treatment given her bone involvement and lymph node metastasis. Patient is somewhat concerned about this significant operation with her advanced age but understands this would be crucial for controlling this aggressive lesion.  PLAN: Patient will meet again with Dr. Janace Hoard for further discussion of surgical options. If patient decides against surgery then she will return to radiation oncology for additional evaluation. If patient does not wish to proceed with surgery then I may also request medical oncology evaluation for possible radiosensitizing chemotherapy.   I spent 60 minutes minutes face to face with the patient and more than 50% of that time was spent in counseling and/or coordination of care.   ------------------------------------------------  Blair Promise, PhD, MD

## 2014-04-21 NOTE — Progress Notes (Signed)
Please see the Nurse Progress Note in the MD Initial Consult Encounter for this patient. 

## 2014-04-22 ENCOUNTER — Other Ambulatory Visit (HOSPITAL_COMMUNITY): Payer: Medicare Other

## 2014-04-22 ENCOUNTER — Ambulatory Visit (HOSPITAL_COMMUNITY): Payer: Medicare Other

## 2014-04-23 ENCOUNTER — Telehealth: Payer: Self-pay | Admitting: *Deleted

## 2014-04-23 NOTE — Telephone Encounter (Signed)
In follow-up to my conversation with patient, I called [facility], spoke with Jokebed.   I indicated pt stated she LVM yesterday for scheduling of f/u visit with Dr. Janace Hoard for further discussion of surgical tmt for her cancer. I emphasized importance of this follow-up appt, requested that it be arranged as soon as possible.  Jokebed verbalized understanding.  Gayleen Orem, RN, BSN, [place] at [place] [phone]

## 2014-04-23 NOTE — Telephone Encounter (Signed)
1. Called pt to introduce myself as the oncology nurse navigator that works with Dr. Sondra Come.  I explained by role as a member of her Care Team, provided her my contact information. 2. She discussed that based on her consult with Dr. Sondra Come earlier this week, surgical treatment of her cancer is recommended.  She indicated she LVM yesterday with Dr. Janace Hoard' office to schedule f/u consult to discuss further but she has not heard back from them.  I indicated I would call his office to facilitate appt, she expressed appreciation. 3. I provided her my contact information, encouraged her to share with her dtrs. 4. I encouraged her or her dtrs to call me as she proceeds with tmt.  She verbalized understanding.  Gayleen Orem, RN, BSN, [place] at [place] [phone]

## 2014-04-23 NOTE — Telephone Encounter (Signed)
Returned pt dtr Mary [name]'s VM.  She is patient's HCPOA. 1. She expressed need for information re: prognosis without surgery intervention to facilitate informed decision by her mother whether or not to have surgery,     I indicated I would relay her request to Drs. Janace Hoard and Kinard for follow-up. 2. She also requested that her mother's next appt with Dr. Janace Hoard be scheduled for a Monday or Friday to facilitate her attendance from her home in [place].    I indicated I would inform Dr. Janace Hoard' office. 3. I encouraged her to call me with future needs/concerns.  She verbalized appreciation.  Gayleen Orem, RN, BSN, [place] at [place] [phone]

## 2014-05-07 ENCOUNTER — Telehealth: Payer: Self-pay | Admitting: *Deleted

## 2014-05-07 NOTE — Telephone Encounter (Addendum)
Called patient to follow up on surgical tmt decision, LVM requesting return call.  Place similar call to dtr [facility]), left same message.  Gayleen Orem, RN, BSN, [place] at [place] [phone]

## 2014-05-26 ENCOUNTER — Inpatient Hospital Stay (HOSPITAL_COMMUNITY): Admission: RE | Admit: 2014-05-26 | Payer: Medicare Other | Source: Ambulatory Visit

## 2014-05-28 ENCOUNTER — Encounter (HOSPITAL_COMMUNITY): Admission: RE | Payer: Self-pay | Source: Ambulatory Visit

## 2014-05-28 ENCOUNTER — Inpatient Hospital Stay (HOSPITAL_COMMUNITY): Admission: RE | Admit: 2014-05-28 | Payer: Medicare Other | Source: Ambulatory Visit | Admitting: Otolaryngology

## 2014-05-28 SURGERY — DISSECTION, NECK, RADICAL
Anesthesia: General | Laterality: Right

## 2014-06-21 ENCOUNTER — Telehealth: Payer: Self-pay | Admitting: *Deleted

## 2014-06-21 NOTE — Telephone Encounter (Signed)
Called patient to check on well-being, spoke with her dtr Levada Dy.   She reported her mother had flap surgery with Dr. Vicie Mutters at [facility] approximately 3 weeks ago, is recovering well. She is not aware of any needs with Dr. Sondra Come or this navigator at this time, understands we can be contacted per Dr. Waynard Edwards guidance.  Gayleen Orem, RN, BSN, [place] at [place] [phone]

## 2014-08-09 ENCOUNTER — Other Ambulatory Visit: Payer: Self-pay

## 2014-10-15 ENCOUNTER — Telehealth: Payer: Self-pay | Admitting: *Deleted

## 2014-10-15 NOTE — Telephone Encounter (Signed)
Dr. Jesusita Oka office at [facility] is sending CD of CT scan done 10/15/14

## 2014-10-21 ENCOUNTER — Telehealth: Payer: Self-pay | Admitting: *Deleted

## 2014-10-21 NOTE — Telephone Encounter (Signed)
  Oncology Nurse Navigator Documentation   Navigator Encounter Type: Introductory phone call (10/21/14 1638)     Called patient, reintroduced myself as the H&N Navigator what works with the [place] MDs who treat H&N cancers.   We had previously met earlier this year when she consulted with Dr. Sondra Come.   She verbalized understanding that she has been referred to Dr. Isidore Moos by Dr. Owens Shark, [facility]; has an appt with Dr. Isidore Moos next Wed at 8:30/9:00. I reviewed with her the arrival/registration process, provided her my phone # should she or her dtr have any questions prior to next week's appt. She expressed appreciation for my call.  Gayleen Orem, RN, BSN, [place] at [place] [phone]                     Time Spent with Patient: 15 (10/21/14 1638)

## 2014-10-25 ENCOUNTER — Telehealth: Payer: Self-pay | Admitting: *Deleted

## 2014-10-25 NOTE — Telephone Encounter (Signed)
Received CD of neck w/thyroid will notify Dr. Isidore Moos & Liliane Channel

## 2014-10-26 ENCOUNTER — Encounter: Payer: Self-pay | Admitting: Radiation Oncology

## 2014-10-26 NOTE — Progress Notes (Signed)
Head and Neck Cancer Location of Tumor / Histology: malignant neoplasm of right lower gum  Patient presented with an irritated area in the right side of her mouth. She went to the dentist who referred her to an oral surgery who referred her to Dr. Janace Hoard.  Biopsies revealed:   03/24/14 Mouth, biopsy, right buccal lesion - fragments of invasive well differentiated keratinizing squamous cell carcinoma  Nutrition Status Yes No Comments  Weight changes? [ ]  [X]    Swallowing concerns? [ ]  [X]    PEG? [ ]  [X]     Referrals Yes No Comments  Social Work? [ ]  [X]    Dentistry? [ ]  [X]    Swallowing therapy? [ ]  [X]    Nutrition? [ ]  [X]    Med/Onc? [ ]  [X]     Safety Issues Yes No Comments  Prior radiation? [X]  [ ]    Pacemaker/ICD? [X]  [ ]    Possible current pregnancy? [ ]  [X]    Is the patient on methotrexate? [ ]  [X]     Tobacco/Marijuana/Snuff/ETOH use: used smokeless tobacco for at least 20 years, does not use ETOH or other drugs  Past/Anticipated interventions by otolaryngology, if any: biopsy 03/24/14, flap at [facility]  Past/Anticipated interventions by medical oncology, if any: unknown  Current Complaints / other details:

## 2014-10-27 ENCOUNTER — Ambulatory Visit
Admission: RE | Admit: 2014-10-27 | Discharge: 2014-10-27 | Disposition: A | Discharge status: Home / Self Care | Payer: Medicare Other | Source: Ambulatory Visit | Attending: Radiation Oncology | Admitting: Radiation Oncology

## 2014-10-27 ENCOUNTER — Encounter: Payer: Self-pay | Admitting: *Deleted

## 2014-10-27 ENCOUNTER — Encounter: Payer: Self-pay | Admitting: Radiation Oncology

## 2014-10-27 VITALS — BP 145/54 | HR 33 | Temp 98.3°F | Ht 63.0 in | Wt 159.0 lb

## 2014-10-27 DIAGNOSIS — Z7982 Long term (current) use of aspirin: Secondary | ICD-10-CM | POA: Diagnosis not present

## 2014-10-27 DIAGNOSIS — Z87891 Personal history of nicotine dependence: Secondary | ICD-10-CM | POA: Diagnosis not present

## 2014-10-27 DIAGNOSIS — E039 Hypothyroidism, unspecified: Secondary | ICD-10-CM | POA: Insufficient documentation

## 2014-10-27 DIAGNOSIS — C411 Malignant neoplasm of mandible: Secondary | ICD-10-CM

## 2014-10-27 DIAGNOSIS — I10 Essential (primary) hypertension: Secondary | ICD-10-CM | POA: Insufficient documentation

## 2014-10-27 DIAGNOSIS — Z510 Encounter for antineoplastic radiation therapy: Secondary | ICD-10-CM | POA: Insufficient documentation

## 2014-10-27 DIAGNOSIS — Z79899 Other long term (current) drug therapy: Secondary | ICD-10-CM | POA: Diagnosis not present

## 2014-10-27 DIAGNOSIS — I739 Peripheral vascular disease, unspecified: Secondary | ICD-10-CM | POA: Insufficient documentation

## 2014-10-27 NOTE — Progress Notes (Signed)
Head and Neck Cancer Location of Tumor / Histology: malignant neoplasm of right lower gum  Patient presented with an irritated area in the right side of her mouth. She went to the dentist who referred her to an oral surgery who referred her to Dr. Janace Hoard.  Biopsies revealed:   03/24/14 Mouth, biopsy, right buccal lesion - fragments of invasive well differentiated keratinizing squamous cell carcinoma  Nutrition Status Yes No Comments  Weight changes? [ ]  [X]    Swallowing concerns? [ ]  [X]    PEG? [ ]  [X]     Referrals Yes No Comments  Social Work? [ ]  [X]    Dentistry? [ ]  [X]    Swallowing therapy? [ ]  [X]    Nutrition? [ ]  [X]    Med/Onc? [ ]  [X]     Safety Issues Yes No Comments  Prior radiation? [X]  [ ]    Pacemaker/ICD? ] [ X] no pacemaker called patient 11/02/14  Possible current pregnancy? [ ]  [X]    Is the patient on methotrexate? [ ]  [X]     Tobacco/Marijuana/Snuff/ETOH use: used smokeless tobacco for at least 20 years, does not use ETOH or other drugs  Past/Anticipated interventions by otolaryngology, if any: biopsy 03/24/14, flap at [facility]  Past/Anticipated interventions by medical oncology, if any: unknown  Current Complaints / other details:

## 2014-10-27 NOTE — Progress Notes (Signed)
Head and Neck Cancer Location of Tumor / Histology: malignant neoplasm of right lower gum  Patient presented with an irritated area in the right side of her mouth. She went to the dentist who referred her to an oral surgery who referred her to Dr. Janace Hoard.  Biopsies revealed:   03/24/14 Mouth, biopsy, right buccal lesion - fragments of invasive well differentiated keratinizing squamous cell carcinoma  Nutrition Status Yes No Comments  Weight changes? [ X] []    Swallowing concerns? [ ]  [X]    PEG? [ ]  [X]     Referrals Yes No Comments  Social Work? [ ]  [X]    Dentistry? [ ]  [X]    Swallowing therapy? [ ]  [X]    Nutrition? [ ]  [X]    Med/Onc? [ ]  [X]     Safety Issues Yes No Comments  Prior radiation? [X]  [ ]    Pacemaker/ICD? [X]  [ ]    Possible current pregnancy? [ ]  [X]    Is the patient on methotrexate? [ ]  [X]     Tobacco/Marijuana/Snuff/ETOH use: used smokeless tobacco for at least 20 years, does not use ETOH or other drugs  Past/Anticipated interventions by otolaryngology, if any: biopsy 03/24/14, flap at [facility]  Past/Anticipated interventions by medical oncology, if any: unknown  Current Complaints / other details: soreness in the left jaw

## 2014-10-27 NOTE — Progress Notes (Signed)
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 female who presented with a right floor of mouth lesion. PET was negative for distant disease.  She underwent resection by Dr. Vicie Mutters at [facility] on 06/01/14.  Pathologic stage was pT4a pN2b; the tumor was 3.1 cm, grade 1, margins positive at the inferior and anterior mucosal margins as well as the anterior mandibular margin.  3 out of 9 lymph nodes from the right neck were positive.  She had a difficult post operative course and opted not to have adjuvant therapy.  Due to concerns of recurrence, she saw Dr. Vicie Mutters recently with a CT of her neck on 10/15/14.  This revealed a 3.5 x 2.8 x 4.6 cm recurrent tumor in the right floor of mouth.  This invades the remaining right mandible and extends into the right sublingual and submandibular spaces as well as the deep margins of the reconstruction flap.  There are suspicious nodes in left level 1a, left level 1b, right parotid right level 3.  She has not had recent systemic imaging.  She first noticed the signs of possible recurrence when she experienced sore front teeth that affected her everyday eating.  She asked her dentist about this discomfort first, next consulting Dr. Vicie Mutters.  Dr. Vicie Mutters preformed a biopsy on 09/30/14 that revealed invasive cell carcinoma of the floor of mouth.  She notes that she has trouble chewing, salivary leakage, and 15 lbs weight loss since April.  She quit smoking and tobacco  usage many years ago.  She experiences anxiety but does not get winded.  She denies trouble swallowing as long as she sticks to soft foods.    PREVIOUS RADIATION THERAPY: No  PAST MEDICAL HISTORY:  has a past medical history of Arthritis; Thyroid disease; Hypertension; Hypothyroidism; Peripheral vascular disease; Frequency of urination; Cancer; and Skin cancer.    PAST SURGICAL HISTORY: Past Surgical History  Procedure Laterality Date  . Tonsillectomy    . Eye surgery Bilateral bilateral  . Total hip arthroplasty Left 08/04/2013    Procedure: LEFT TOTAL HIP ARTHROPLASTY ANTERIOR APPROACH;  Surgeon: Renette Butters, MD;  Location: [place];  Service: Orthopedics;  Laterality: Left;  Marland Kitchen Mucosal advancement flap      FAMILY HISTORY: family history includes Cancer in her brother; Lung cancer in her father; Stroke in her mother.  SOCIAL HISTORY:  reports that she has never smoked. She has quit using smokeless tobacco. She reports that she does not drink alcohol or use illicit drugs.  ALLERGIES: Review of patient's allergies indicates no known allergies.  MEDICATIONS:  Current Outpatient Prescriptions  Medication Sig Dispense Refill  . acetaminophen (TYLENOL) 500 MG tablet Take 500 mg by mouth every 6 (six) hours as needed for moderate pain.    Marland Kitchen aspirin 81 MG tablet Take 81 mg by mouth daily.    Marland Kitchen atorvastatin (LIPITOR) 10 MG tablet Take 10 mg by mouth daily.    . Calcium-Vitamin D-Vitamin K (VIACTIV PO) Take by  mouth daily.    . Cholecalciferol (VITAMIN D-3 PO) Take 5,000 Int'l Units by mouth 1 day or 1 dose.    . docusate sodium (COLACE) 100 MG capsule Take 1 capsule (100 mg total) by mouth 2 (two) times daily. Continue this while taking narcotics to help with bowel movements 30 capsule 1  . furosemide (LASIX) 20 MG tablet Take 10-20 mg by mouth daily. Take 20 mg every other day alternating with 10 mg    . gabapentin (NEURONTIN) 300 MG capsule Take 300 mg by mouth daily as needed (pain).       Marland Kitchen ibuprofen (ADVIL,MOTRIN) 200 MG tablet Take 200 mg by mouth every 6 (six) hours as needed.    Marland Kitchen levothyroxine (SYNTHROID, LEVOTHROID) 50 MCG tablet Take 50 mcg by mouth daily before breakfast.    . losartan (COZAAR) 100 MG tablet Take 100 mg by mouth daily.    . Multiple Vitamin (MULTIVITAMIN) capsule Take 1 capsule by mouth daily.    Marland Kitchen omega-3 acid ethyl esters (LOVAZA) 1 G capsule Take 1 g by mouth daily.    . psyllium (REGULOID) 0.52 G capsule Take 0.52 g by mouth daily as needed (constipation).    . verapamil (COVERA HS) 240 MG (CO) 24 hr tablet Take 240 mg by mouth at bedtime.    . methocarbamol (ROBAXIN) 500 MG tablet Take 1 tablet (500 mg total) by mouth every 6 (six) hours as needed. (Patient not taking: Reported on 04/21/2014) 40 tablet 1   No current facility-administered medications for this encounter.    REVIEW OF SYSTEMS:  Notable for that above.   PHYSICAL EXAM:  height is 5\' 3"  (1.6 m) and weight is 159 lb (72.122 kg). Her temperature is 98.3 F (36.8 C). Her blood pressure is 145/54 and her pulse is 33. Her oxygen saturation is 99%.   General: Alert and oriented, in no acute distress HEENT: Head is normocephalic. Extraocular movements are intact. Oropharynx is notable for trismus.  Nodular growth that appears to reach the midline of her floor of mouth and extends along the gumline of the right mandible into the floor of mouth on the right (unclear if this is tumor vs graft).  The adjacent anterior right tooth#26 is a little bit loose.  Tongue is midline.  Well healed surgical scars along right cheek and neck. Neck: Neck is notable for firmness in the level 1b region of right neck which may be post-operative in nature.  I am not able to appreciate any obvious lymph node masses in the cervical or supraclavicular neck bilaterally. Heart:  Soft systolic murmur and irregular rhythm Chest: Clear to auscultation bilaterally, with no rhonchi, wheezes, or rales. Abdomen: Soft,  nontender, nondistended, with no rigidity or guarding.  Normoactive bowel signs Extremities: No cyanosis or edema. Lymphatics: see Neck Exam Skin: No concerning lesions. Neurologic:  Speech is fluent. Coordination is intact. Uses cane to walk. Psychiatric: Judgment and insight are intact. Affect is appropriate.   ECOG = 2  0 - Asymptomatic (Fully active, able to carry on all predisease activities without restriction)  1 - Symptomatic but completely ambulatory (Restricted in physically strenuous activity but ambulatory and able to carry out work of a light or sedentary nature. For example, light housework, office work)  2 - Symptomatic, <50% in bed during the day (Ambulatory and capable of all self care but unable to carry out any work activities. Up and about more than 50% of waking hours)  3 - Symptomatic, >50% in bed,  but not bedbound (Capable of only limited self-care, confined to bed or chair 50% or more of waking hours)  4 - Bedbound (Completely disabled. Cannot carry on any self-care. Totally confined to bed or chair)  5 - Death   Eustace Pen MM, Creech RH, Tormey DC, et al. [phone]). "Toxicity and response criteria of the [facility]". [place] Oncol. 5 (6): 649-55   LABORATORY DATA:  Lab Results  Component Value Date   WBC 13.2 08/12/2013   HGB 11.9* 04/15/2014   HCT 35.0* 04/15/2014   MCV 87.8 07/31/2013   PLT 343 08/12/2013   CMP     Component Value Date/Time   NA 140 04/15/2014 1338   NA 140 08/24/2013   K 4.3 04/15/2014 1338   CL 103 04/15/2014 1338   CO2 22 08/05/2013 0507   GLUCOSE 94 04/15/2014 1338   BUN 20 04/15/2014 1338   BUN 27* 08/24/2013   CREATININE 1.30* 04/15/2014 1338   CALCIUM 8.8 08/05/2013 0507   GFRNONAA 37* 08/05/2013 0507   GFRAA 43* 08/05/2013 0507         RADIOGRAPHY: No results found.    IMPRESSION/PLAN:    This is a delightful patient with recurrent head and neck cancer. I do recommend palliative  radiotherapy over 2-3 weeks to the oral tumor for this patient as it is starting to cause pain and the goal would be to stunt its growth.  Treatment is unlikely to elongate her lifespan. We discussed hospice as an alternative to RT.  She would like to think about her options but tentatively plans to proceed with RT. We discussed the pros/cons of full body restaging scans. We will hold off on this as it is unlikely to change our management.  We discussed the potential risks, benefits, and side effects of radiotherapy. We talked in detail about acute and late effects. We discussed that some of the most bothersome acute effects may be mucositis, dysgeusia, salivary changes, skin irritation,  dehydration, weight loss and fatigue.  No guarantees of treatment were given. A consent form was signed and placed in the patient's medical record.  Simulation (treatment planning) will take place once patient is released by Dr Enrique Sack DDS  We also discussed that the treatment of head and neck cancer is a multidisciplinary process to maximize treatment outcomes and quality of life. For this reasons the following referrals have been or will be made:    Dentistry for dental evaluation, would be helpful to have a device made to sweep her tongue to the left, away from the field of RT. Any oral care to improve oral health is encouraged.   Nutritionist for nutritional support during and after treatment.   __________________________________________   [name], MD

## 2014-10-28 ENCOUNTER — Encounter (HOSPITAL_COMMUNITY): Payer: Self-pay | Admitting: Dentistry

## 2014-10-28 ENCOUNTER — Telehealth: Payer: Self-pay | Admitting: *Deleted

## 2014-10-28 ENCOUNTER — Ambulatory Visit (HOSPITAL_COMMUNITY): Payer: Self-pay | Admitting: Dentistry

## 2014-10-28 VITALS — BP 131/34 | HR 66 | Temp 98.3°F

## 2014-10-28 DIAGNOSIS — C411 Malignant neoplasm of mandible: Secondary | ICD-10-CM | POA: Diagnosis present

## 2014-10-28 DIAGNOSIS — K0530 Chronic periodontitis, unspecified: Secondary | ICD-10-CM

## 2014-10-28 DIAGNOSIS — K08409 Partial loss of teeth, unspecified cause, unspecified class: Secondary | ICD-10-CM

## 2014-10-28 DIAGNOSIS — K0889 Other specified disorders of teeth and supporting structures: Secondary | ICD-10-CM

## 2014-10-28 DIAGNOSIS — Z01818 Encounter for other preprocedural examination: Secondary | ICD-10-CM | POA: Diagnosis not present

## 2014-10-28 DIAGNOSIS — K036 Deposits [accretions] on teeth: Secondary | ICD-10-CM

## 2014-10-28 DIAGNOSIS — IMO0002 Reserved for concepts with insufficient information to code with codable children: Secondary | ICD-10-CM

## 2014-10-28 DIAGNOSIS — M264 Malocclusion, unspecified: Secondary | ICD-10-CM

## 2014-10-28 MED ORDER — SODIUM FLUORIDE 1.1 % DT CREA: TOPICAL_CREAM | DENTAL | Status: AC

## 2014-10-28 NOTE — Progress Notes (Signed)
DENTAL CONSULTATION  Date of Consultation:  10/28/2014 Patient Name:   Mary [name] Date of Birth:   [birth year] Medical Record Number: [id number]  VITALS: BP 131/34 mmHg  Pulse 66  Temp(Src) 98.3 F (36.8 C) (Oral)  CHIEF COMPLAINT: Patient referred by Dr. Isidore Moos for a medically necessary preradiation therapy dental protocol examination.   HPI: Mary [name] is an 81 year old female with history of squamous cell carcinoma of the floor of mouth and mandibular alveolar ridge. Patient has a history of previous surgical resection of cancer including right floor of mouth, neck dissection, tracheostomy, right partial mandibulectomy, and free flap reconstruction on 06/01/2014 by Dr. Ardath Sax at [facility].  Patient did not have any subsequent adjuvant chemoradiation therapy. Patient subsequently had a new biopsy taken by Dr. Vicie Mutters on 09/30/2014 with a squamous cell carcinoma diagnosis. Patient was not felt to be a candidate for additional surgical resection. Patient then referred to Dr. Isidore Moos for palliative radiation therapy. Patient is now seen as part of a medically necessary preradiation therapy dental protocol examination.  The patient currently denies having acute toothaches, swellings, or abscesses. Patient does indicate her mandibular right anterior teeth are "sore"from time to time. Patient was last seen by her primary dentist, Dr. Lollie Marrow, on 09/14/2014 for an exam and cleaning. Patient indicates that she sees Dr. Deanna Artis on an every 9 month basis.  Patient denies having any partial dentures.  PROBLEM LIST: Patient Active Problem List   Diagnosis Date Noted  . 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 Left fibular fracture 09/15/2013  . Peripheral neuropathy 09/15/2013  . Thyroid activity decreased 08/21/2013  . Primary osteoarthritis of left hip 08/21/2013  . Hyperlipidemia 08/11/2013  . Hypothyroidism  08/11/2013  . Essential hypertension, benign 08/11/2013  . Acute blood loss anemia 08/11/2013  . Constipation 08/11/2013  . DJD (degenerative joint disease) 08/04/2013    PMH: Past Medical History  Diagnosis Date  . Arthritis   . Thyroid disease   . Hypertension   . Hypothyroidism   . Peripheral vascular disease   . Frequency of urination   . Cancer     malignant neoplasm of right lower gum  . Skin cancer     skin cancer of nose    PSH: Past Surgical History  Procedure Laterality Date  . Tonsillectomy    . Eye surgery Bilateral bilateral  . Total hip arthroplasty Left 08/04/2013    Procedure: LEFT TOTAL HIP ARTHROPLASTY ANTERIOR APPROACH;  Surgeon: Renette Butters, MD;  Location: [place];  Service: Orthopedics;  Laterality: Left;  Marland Kitchen Mucosal advancement flap      ALLERGIES: No Known Allergies  MEDICATIONS: Current Outpatient Prescriptions  Medication Sig Dispense Refill  . acetaminophen (TYLENOL) 500 MG tablet Take 500 mg by mouth every 6 (six) hours as needed for moderate pain.    Marland Kitchen aspirin 81 MG tablet Take 81 mg by mouth daily.    Marland Kitchen atorvastatin (LIPITOR) 10 MG tablet Take 10 mg by mouth daily.    . Calcium-Vitamin D-Vitamin K (VIACTIV PO) Take by mouth daily.    . Cholecalciferol (VITAMIN D-3 PO) Take 5,000 Int'l Units by mouth 1 day or 1 dose.    . docusate sodium (COLACE) 100 MG capsule Take 1 capsule (100 mg total) by mouth 2 (two) times daily. Continue this while taking narcotics to help with bowel movements 30 capsule 1  . furosemide (LASIX) 20 MG tablet Take 10-20 mg by  mouth daily. Take 20 mg every other day alternating with 10 mg    . gabapentin (NEURONTIN) 300 MG capsule Take 300 mg by mouth daily as needed (pain).     Marland Kitchen ibuprofen (ADVIL,MOTRIN) 200 MG tablet Take 200 mg by mouth every 6 (six) hours as needed.    Marland Kitchen levothyroxine (SYNTHROID, LEVOTHROID) 50 MCG tablet Take 50 mcg by mouth daily before breakfast.    . losartan (COZAAR) 100 MG tablet Take 100 mg  by mouth daily.    . methocarbamol (ROBAXIN) 500 MG tablet Take 1 tablet (500 mg total) by mouth every 6 (six) hours as needed. (Patient not taking: Reported on 04/21/2014) 40 tablet 1  . Multiple Vitamin (MULTIVITAMIN) capsule Take 1 capsule by mouth daily.    Marland Kitchen omega-3 acid ethyl esters (LOVAZA) 1 G capsule Take 1 g by mouth daily.    . psyllium (REGULOID) 0.52 G capsule Take 0.52 g by mouth daily as needed (constipation).    . sodium fluoride (PREVIDENT 5000 PLUS) 1.1 % CREA dental cream Apply to tooth brush. Brush teeth for 2 minutes. Spit out excess-DO NOT swallow. Repeat nightly. 1 Tube prn  . verapamil (COVERA HS) 240 MG (CO) 24 hr tablet Take 240 mg by mouth at bedtime.     No current facility-administered medications for this visit.    LABS: Lab Results  Component Value Date   WBC 13.2 08/12/2013   HGB 11.9* 04/15/2014   HCT 35.0* 04/15/2014   MCV 87.8 07/31/2013   PLT 343 08/12/2013      Component Value Date/Time   NA 140 04/15/2014 1338   NA 140 08/24/2013   K 4.3 04/15/2014 1338   CL 103 04/15/2014 1338   CO2 22 08/05/2013 0507   GLUCOSE 94 04/15/2014 1338   BUN 20 04/15/2014 1338   BUN 27* 08/24/2013   CREATININE 1.30* 04/15/2014 1338   CALCIUM 8.8 08/05/2013 0507   GFRNONAA 37* 08/05/2013 0507   GFRAA 43* 08/05/2013 0507   Lab Results  Component Value Date   INR 0.97 07/31/2013   No results found for: PTT  SOCIAL HISTORY: Social History   Social History  . Marital Status: Widowed    Spouse Name: N/A  . Number of Children: 3  . Years of Education: N/A   Occupational History  . Not on file.   Social History Main Topics  . Smoking status: Never Smoker   . Smokeless tobacco: Former Systems developer     Comment: used smokeless tobacco for at least 20 yesrs  . Alcohol Use: No  . Drug Use: No  . Sexual Activity: No   Other Topics Concern  . Not on file   Social History Narrative    FAMILY HISTORY: Family History  Problem Relation Age of Onset  . Stroke  Mother   . Lung cancer Father   . Cancer Brother     REVIEW OF SYSTEMS: Reviewed with the patient included in dental record.  DENTAL HISTORY: CHIEF COMPLAINT: Patient referred by Dr. Isidore Moos for a medically necessary preradiation therapy dental protocol examination.   HPI: LY WASS is an 81 year old female with history of squamous cell carcinoma of the floor of mouth and mandibular alveolar ridge. Patient has a history of previous surgical resection of cancer including right floor of mouth, neck dissection, tracheostomy, right partial mandibulectomy, and free flap reconstruction on 06/01/2014 by Dr. Ardath Sax at [facility].  Patient did not have any subsequent adjuvant chemoradiation therapy. Patient subsequently had a  new biopsy taken by Dr. Vicie Mutters on 09/30/2014 with a squamous cell carcinoma diagnosis. Patient was not felt to be a candidate for additional surgical resection. Patient then referred to Dr. Isidore Moos for palliative radiation therapy. Patient is now seen as part of a medically necessary preradiation therapy dental protocol examination.  The patient currently denies having acute toothaches, swellings, or abscesses. Patient does indicate her mandibular right anterior teeth are "sore"from time to time. Patient was last seen by her primary dentist, Dr. Lollie Marrow, on 09/14/2014 for an exam and cleaning. Patient indicates that she sees Dr. Deanna Artis on an every 9 month basis.  Patient denies having any partial dentures.  DENTAL EXAMINATION: GENERAL: The patient is a well-developed, well-nourished female in no acute distress. HEAD AND NECK: The right neck is consistent with previous surgical resection, neck dissection, right partial mandibulectomy, and free flap reconstruction, and previous tracheostomy. I do not palpate any left neck lymphadenopathy. The patient has a maximum interincisal opening of approximately 25 mm. INTRAORAL EXAM: The patient has normal saliva.  The right mandible, floor of mouth, and tongue are consistent with previous surgical resection and reconstruction. DENTITION: The patient is missing tooth numbers 1, 16, 17, 18, 26, 27, 28, 29, 30, 31, and 32. PERIODONTAL: Patient has chronic periodontitis with minimal plaque accumulations, generalized gingival recession, and tooth mobility. Tooth mobility of tooth #25 is measured as 1+. Tooth #24 has plus mobility. The patient has relatively good oral hygiene. DENTAL CARIES/SUBOPTIMAL RESTORATIONS: No obvious dental caries are noted. ENDODONTIC: Patient currently denies acute pulpitis symptoms. CROWN AND BRIDGE: Patient has a crown on tooth #19. PROSTHODONTIC: She does not have any partial dentures. OCCLUSION: The patient has a poor occlusal scheme secondary to multiple missing teeth, surgical resection, and minimal occlusion at primarily occurs in a traumatic fashion on tooth #25.  RADIOGRAPHIC INTERPRETATION: An orthopantogram was taken today. There are multiple missing teeth. There is evidence of previous partial mandibulectomy from the distal of #27 through the distal of #30. There is no evidence of surgical plating. Multiple surgical clips are noted in the surgical field. There is moderate to severe bone loss noted. No obvious dental caries are noted. No obvious periapical pathology is noted. Multiple dental restorations are noted. The maxillary sinuses are noted and appeared to be well aerated. Bilateral hearing aids are noted.   ASSESSMENTS: 1. History of squamous cell carcinoma of the right floor of mouth and mandibualr alveolar ridge. 2. Status post surgical resection with neck dissection, right partial mandibulectomy, free flap reconstruction, and previous tracheostomy. 3. Anticipated palliative radiation therapy 4. Multiple missing teeth 5. Chronic periodontitis with bone loss 6. Gingival recession 7. Tooth mobility 8. Accretions-minimal with relatively good oral hygiene 9.  Malocclusion 10. Traumatic occlusion of tooth #25  PLAN/RECOMMENDATIONS: 1. I discussed the risks, benefits, and complications of various treatment options with the patient and her daughter in relationship to her medical and dental conditions, anticipated radiation therapy and radiation therapy side effects to include xerostomia, radiation caries, trismus, mucositis, taste changes, gum and jawbone changes, and risk for infection and osteonecrosis We discussed various treatment options to include no treatment, extraction of teeth, periodontal therapy, fluoride therapy, implant therapy, and replacement of missing teeth as indicated. We also discussed fabrication of a tongue positioner type appliance as requested by Dr. Isidore Moos. The patient currently does not want to have any additional extractions at this time. The patient did agree to proceed with brushing of her teeth with PreviDent 5000 fluoride therapy at bedtime. Patient  also expresses understanding that tongue positioner type device would not be fabricated by Dental Medicine at this time.  Patient understands that she will continue to follow up with Dr. Deanna Artis for her periodontal maintenance and general dental care needs. Patient and daughter were instructed to contact Dr. Isidore Moos to proceed with start of radiation therapy and simulation as indicated.  2. Discussion of findings with medical team and coordination of future medical and dental care as needed.   I spent in excess of  90 minutes during the conduct of this consultation and >50% of this time involved direct face-to-face encounter for counseling and/or coordination of the patient's care.    Lenn Cal, DDS

## 2014-10-28 NOTE — Patient Instructions (Signed)

## 2014-10-28 NOTE — Telephone Encounter (Signed)
  Oncology Nurse Navigator Documentation   Navigator Encounter Type: Telephone (10/28/14 1625) Patient Visit Type: Follow-up (10/28/14 1625)       Interventions: Coordination of Care (10/28/14 1625)     Called patient and dtr Levada Dy to answer questions/check on tmt decision s/p yesterday's appt with Dr. Isidore Moos, LVM on both their phones.   Indicated CT SIM appt has been scheduled for 9/21 0900 (which I arranged per receipt of report from Dr. Enrique Sack, Dental Medicine) pending her confirmation of proceeding with RT. Requested return call.  Gayleen Orem, RN, BSN, [place] [facility] [place] [phone]           Time Spent with Patient: 15 (10/28/14 1625)

## 2014-10-28 NOTE — Progress Notes (Signed)
  Oncology Nurse Navigator Documentation   Navigator Encounter Type: Initial RadOnc (10/27/14 0910)     Barriers/Navigation Needs: Education (10/27/14 0910) Education: Understanding Cancer/ Treatment Options;Coping with Diagnosis/ Prognosis (10/27/14 0910)     Met with patient during initial consult with Dr. Isidore Moos.  She was accompanied by her 3 dtrs.  Dtr Levada Dy indicated she will be staying with her mother during tmt; her mother lives alone. 1. Further introduced myself as her Navigator, explained my role as a member of the Care Team.   2. Provided New Patient Information packet, discussed contents:  Contact information for physician(s), myself, other members of the Care Team.  Advance Directive information ([place] blue pamphlet with LCSW contact info).  She stated she has ADs, will bring copies next visit.  Fall Prevention Patient Safety Plan  Appointment Guideline  ACS Referral form  [place] with highlight of [place] 3. Provided introductory explanation of radiation treatment including SIM planning and purpose of Aquaplast head and shoulder mask, showed them example.   4. Patient expressed uncertainty about proceeding with tmt, will discuss further with her family.   5. I encouraged them to contact me with questions/concerns, update on tmt decision.  They verbalized understanding of information provided.    Gayleen Orem, RN, BSN, [place] at [place] [phone]              Time Spent with Patient: 45 (10/27/14 0910)

## 2014-10-29 ENCOUNTER — Inpatient Hospital Stay
Admission: RE | Admit: 2014-10-29 | Discharge: 2014-10-29 | Disposition: A | Discharge status: Home / Self Care | Payer: Self-pay | Source: Ambulatory Visit | Attending: Radiation Oncology | Admitting: Radiation Oncology

## 2014-10-29 ENCOUNTER — Telehealth: Payer: Self-pay | Admitting: *Deleted

## 2014-10-29 ENCOUNTER — Other Ambulatory Visit: Payer: Self-pay | Admitting: Radiation Oncology

## 2014-10-29 ENCOUNTER — Telehealth: Payer: Self-pay

## 2014-10-29 DIAGNOSIS — C801 Malignant (primary) neoplasm, unspecified: Secondary | ICD-10-CM

## 2014-10-29 NOTE — Telephone Encounter (Signed)
Received a call from [employer] case # [id number] verification form for service member to come home.Marland KitchenSpoke with Mariella Saa whom requested information.Verified with patient and daughter ok to discuss information .Questions asked diagnosis, prognosis (fair to poor), current condition (palliative radiation reccommended)and life expectancy:unknown but aware treatment will not elongate lifespan)I spoke with Courtney Paris.

## 2014-10-29 NOTE — Telephone Encounter (Signed)
  Oncology Nurse Navigator Documentation   Navigator Encounter Type: Telephone (10/29/14 1232) Patient Visit Type: Follow-up (10/29/14 1232)     Patient's dtr Mary [name] called to inform that her mother has decided to proceed with RT. I confirmed her understanding of the 9/21 0900 CT SIM appt. I addressed her questions re prognosis to the best of my ability, encouraged her to have further conversation with Dr. Isidore Moos. She verbalized understanding of information provided.  Gayleen Orem, RN, BSN, [place] at [place] [phone]                   Time Spent with Patient: 15 (10/29/14 1232)

## 2014-11-02 ENCOUNTER — Telehealth: Payer: Self-pay | Admitting: *Deleted

## 2014-11-02 NOTE — Addendum Note (Signed)
Encounter addended by: Benn Moulder, RN on: 11/02/2014 11:51 AM<BR>     Documentation filed: Demographics Visit

## 2014-11-02 NOTE — Telephone Encounter (Signed)
Called patient home,. And spoke with the patient, NO, she doesn't have a Pacemaker nor does she see a Film/video editor, , thanked  The patient and daughter will see her tomorrow for ct simulation,  1:36 PM

## 2014-11-03 ENCOUNTER — Ambulatory Visit
Admission: RE | Admit: 2014-11-03 | Discharge: 2014-11-03 | Disposition: A | Discharge status: Home / Self Care | Payer: Medicare Other | Source: Ambulatory Visit | Attending: Radiation Oncology | Admitting: Radiation Oncology

## 2014-11-03 DIAGNOSIS — C411 Malignant neoplasm of mandible: Secondary | ICD-10-CM

## 2014-11-03 DIAGNOSIS — Z510 Encounter for antineoplastic radiation therapy: Secondary | ICD-10-CM | POA: Diagnosis not present

## 2014-11-03 NOTE — Progress Notes (Signed)
  Radiation Oncology         [phone] ________________________________  Name: PAILYNN [name] MRN: [id number]  Date: 11/03/2014  DOB: 11-14-1925  SIMULATION AND TREATMENT PLANNING NOTE  Outpatient  DIAGNOSIS:     ICD-9-CM ICD-10-CM   1. 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  Identity was confirmed.  All relevant records and images related to the planned course of therapy were reviewed.  The patient freely provided informed written consent to proceed with treatment after reviewing the details related to the planned course of therapy. The consent form was witnessed and verified by the simulation staff.    Then, the patient was set-up in a stable reproducible  supine position for radiation therapy. Aquaplast mask was made.  CT images were obtained.  Surface markings were placed.  The CT images were loaded into the planning software.    TREATMENT PLANNING NOTE: Treatment planning then occurred.  The radiation prescription was entered and confirmed.    A total of 3 medically necessary complex treatment devices were fabricated and supervised by me in the form of 1 custom mask and 2 fields with MLCs to block oral cavity and parotid tissue. I have requested : 3D Simulation  I have requested a DVH of the following structures: oral cavity, parotid glands, target volume.  I have ordered: dentistry consult  The patient will receive 30 Gy in 10 fractions to the right oral cavity mass.   -----------------------------------  [name], MD

## 2014-11-08 ENCOUNTER — Other Ambulatory Visit: Payer: Self-pay | Admitting: Radiation Oncology

## 2014-11-08 ENCOUNTER — Ambulatory Visit: Payer: Medicare Other | Admitting: Nutrition

## 2014-11-08 DIAGNOSIS — C411 Malignant neoplasm of mandible: Secondary | ICD-10-CM

## 2014-11-08 DIAGNOSIS — Z510 Encounter for antineoplastic radiation therapy: Secondary | ICD-10-CM | POA: Diagnosis not present

## 2014-11-08 MED ORDER — HYDROCODONE-ACETAMINOPHEN 5-325 MG PO TABS: 1.0000 | ORAL_TABLET | ORAL | Status: AC | PRN

## 2014-11-08 NOTE — Progress Notes (Signed)
81 year old female diagnosed with recurrent right lower mouth cancer.  She is a patient of Dr. Isidore Moos.  Scheduled for a short round of radiation therapy to be completed October 11.  Past medical history includes arthritis, thyroid disease, hypertension, and PVD.  Medications include Lipitor, vitamin D, Lasix, Synthroid, multivitamin.  Labs were reviewed.  Height: 63 inches. Weight: 159 pounds. Usual body weight: About 175 pounds. BMI: 28.17.  Patient reports problems chewing meats.   She tolerates soft ground beef such as country style steak and meatloaf.   She is drinking oral nutrition supplement 3 times a day. Reports oral intake has improved recently.  Nutrition diagnosis: Unintended weight loss related to recurrent mouth cancer as evidenced by 16 pound weight loss from usual body weight.  Intervention:  Educated patient on strategies for consuming softer, moister foods and liquids that are higher in protein with adequate calories to minimize weight loss. Educated patient to continue oral nutrition supplements such as Ensure Plus 3 times a day between meals. Provided multiple recipes for shakes and smoothies. Also provided coupons for oral nutrition supplements. Educated patient to rinse mouth out with baking soda and salt water. Fact sheets were provided and questions were answered.  Teach back method was used.  Monitoring, evaluation, goals: Patient will tolerate adequate calories and protein to maintain current weight.  Next visit: Patient will contact me for questions or concerns.  **Disclaimer: This note was dictated with voice recognition software. Similar sounding words can inadvertently be transcribed and this note may contain transcription errors which may not have been corrected upon publication of note.**

## 2014-11-09 NOTE — Addendum Note (Signed)
Encounter addended by: Benn Moulder, RN on: 11/09/2014  3:41 PM<BR>     Documentation filed: Charges VN

## 2014-11-10 ENCOUNTER — Ambulatory Visit
Admission: RE | Admit: 2014-11-10 | Discharge: 2014-11-10 | Disposition: A | Discharge status: Home / Self Care | Payer: Medicare Other | Source: Ambulatory Visit | Attending: Radiation Oncology | Admitting: Radiation Oncology

## 2014-11-10 ENCOUNTER — Encounter: Payer: Self-pay | Admitting: *Deleted

## 2014-11-10 DIAGNOSIS — Z510 Encounter for antineoplastic radiation therapy: Secondary | ICD-10-CM | POA: Diagnosis not present

## 2014-11-11 ENCOUNTER — Ambulatory Visit
Admission: RE | Admit: 2014-11-11 | Discharge: 2014-11-11 | Disposition: A | Discharge status: Home / Self Care | Payer: Medicare Other | Source: Ambulatory Visit | Attending: Radiation Oncology | Admitting: Radiation Oncology

## 2014-11-11 DIAGNOSIS — Z510 Encounter for antineoplastic radiation therapy: Secondary | ICD-10-CM | POA: Diagnosis not present

## 2014-11-11 NOTE — Progress Notes (Signed)
  Oncology Nurse Navigator Documentation   Navigator Encounter Type: Treatment (11/10/14 7375) Patient Visit Type: GRJWBD (11/10/14 1705) Treatment Phase: First Radiation Tx (11/10/14 1705)     To provide support and encouragement, care continuity and to assess for needs, met with patient during first LINAC 3 tmt.  Her dtr Levada Dy accompanied her. She tolerated tmt without incident, denied needs/concerns. She understands I can be contacted.  Gayleen Orem, RN, BSN, [place] at [place] [phone]                  Time Spent with Patient: 30 (11/10/14 1705)

## 2014-11-12 ENCOUNTER — Ambulatory Visit
Admission: RE | Admit: 2014-11-12 | Discharge: 2014-11-12 | Disposition: A | Discharge status: Home / Self Care | Payer: Medicare Other | Source: Ambulatory Visit | Attending: Radiation Oncology | Admitting: Radiation Oncology

## 2014-11-12 DIAGNOSIS — Z510 Encounter for antineoplastic radiation therapy: Secondary | ICD-10-CM | POA: Diagnosis not present

## 2014-11-15 ENCOUNTER — Ambulatory Visit
Admission: RE | Admit: 2014-11-15 | Discharge: 2014-11-15 | Disposition: A | Discharge status: Home / Self Care | Payer: Medicare Other | Source: Ambulatory Visit | Attending: Radiation Oncology | Admitting: Radiation Oncology

## 2014-11-15 ENCOUNTER — Encounter: Payer: Self-pay | Admitting: Radiation Oncology

## 2014-11-15 VITALS — BP 147/57 | HR 65 | Temp 97.4°F | Ht 63.0 in | Wt 166.6 lb

## 2014-11-15 DIAGNOSIS — Z79899 Other long term (current) drug therapy: Secondary | ICD-10-CM | POA: Diagnosis not present

## 2014-11-15 DIAGNOSIS — Z510 Encounter for antineoplastic radiation therapy: Secondary | ICD-10-CM | POA: Diagnosis not present

## 2014-11-15 DIAGNOSIS — E039 Hypothyroidism, unspecified: Secondary | ICD-10-CM | POA: Diagnosis not present

## 2014-11-15 DIAGNOSIS — Z87891 Personal history of nicotine dependence: Secondary | ICD-10-CM | POA: Diagnosis not present

## 2014-11-15 DIAGNOSIS — C411 Malignant neoplasm of mandible: Secondary | ICD-10-CM | POA: Diagnosis not present

## 2014-11-15 DIAGNOSIS — I10 Essential (primary) hypertension: Secondary | ICD-10-CM | POA: Diagnosis not present

## 2014-11-15 DIAGNOSIS — Z7982 Long term (current) use of aspirin: Secondary | ICD-10-CM | POA: Diagnosis not present

## 2014-11-15 DIAGNOSIS — I739 Peripheral vascular disease, unspecified: Secondary | ICD-10-CM | POA: Diagnosis not present

## 2014-11-15 MED ORDER — LIDOCAINE VISCOUS 2 % MT SOLN: OROMUCOSAL | Status: AC

## 2014-11-15 NOTE — Progress Notes (Addendum)
   Weekly Management Note:  Outpatient    ICD-9-CM ICD-10-CM   1. 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  CBCT/MVCT images/Port film x-rays were reviewed.  The chart was checked.  Mary [name] is here for her 4th fraction to her mandible. She reports difficulty swallowing large pills, and requires her food to be soft or well cut-up to eat. Her daughter reports she is not eating well at all. She reports increased fatigue, and states for the past couple of days she has had trouble staying awake. She also reports she has pain in the right side of her face at a 7, Her daughter reports she is not taking her norco, but is taking neurontin every 4 hours along with advil at the same time. The skin on the right side of her face is red, slightly tender, and she reports some mild swelling to that area.    Physical Findings:  Wt Readings from Last 3 Encounters:  11/15/14 166 lb 9.6 oz (75.569 kg)  10/27/14 159 lb (72.122 kg)  04/21/14 171 lb 14.4 oz (77.973 kg)    height is 5\' 3"  (1.6 m) and weight is 166 lb 9.6 oz (75.569 kg). Her temperature is 97.4 F (36.3 C). Her blood pressure is 147/57 and her pulse is 65.  NAD, no oral thrush or mucositis.  right facial swelling, oral cavity tumor grossly stable.  CBC    Component Value Date/Time   WBC 13.2 08/12/2013   WBC 10.5 07/31/2013 1246   WBC 13.5* 12/07/2011 1248   RBC 4.09 07/31/2013 1246   RBC 4.17 12/07/2011 1248   HGB 11.9* 04/15/2014 1338   HGB 11.6* 12/07/2011 1248   HCT 35.0* 04/15/2014 1338   HCT 37.7 12/07/2011 1248   PLT 343 08/12/2013   MCV 87.8 07/31/2013 1246   MCV 90.5 12/07/2011 1248   MCH 28.6 07/31/2013 1246   MCH 27.8 12/07/2011 1248   MCHC 32.6 07/31/2013 1246   MCHC 30.8* 12/07/2011 1248   RDW 12.6 07/31/2013 1246      CMP     Component Value Date/Time   NA 140 04/15/2014 1338   NA 140 08/24/2013   K 4.3 04/15/2014 1338   CL 103 04/15/2014 1338   CO2 22 08/05/2013 0507   GLUCOSE 94 04/15/2014 1338   BUN 20 04/15/2014 1338   BUN 27* 08/24/2013   CREATININE 1.30* 04/15/2014 1338   CALCIUM 8.8 08/05/2013 0507   GFRNONAA 37* 08/05/2013 0507   GFRAA 43* 08/05/2013 0507     Impression:  The patient is tolerating radiotherapy.   Plan:  Continue radiotherapy as planned. Taper neurontin this week and try taking vicodin for pain, instead. Constipation precautions reviewed. -----------------------------------  [name], MD

## 2014-11-15 NOTE — Progress Notes (Signed)
Ms. Mary [name] is here for her 4th fraction to her mandible. She reports difficulty swallowing large pills, and requires her food to be soft or well cut-up to eat. Her daughter reports she is not eating well at all. She reports increased fatigue, and states for the past couple of days she has had trouble staying awake. She also reports she has pain in the right side of her face at a 7, Her daughter reports she is not taking her norco, but is taking neurontin every 4 hours along with advil at the same time. The skin on the right side of her face is red, slightly tender, and she reports some mild swelling to that area.  BP 147/57 mmHg  Pulse 65  Temp(Src) 97.4 F (36.3 C)  Ht 5\' 3"  (1.6 m)  Wt 166 lb 9.6 oz (75.569 kg)  BMI 29.52 kg/m2  Wt Readings from Last 3 Encounters:  11/15/14 166 lb 9.6 oz (75.569 kg)  10/27/14 159 lb (72.122 kg)  04/21/14 171 lb 14.4 oz (77.973 kg)

## 2014-11-16 ENCOUNTER — Other Ambulatory Visit: Payer: Self-pay | Admitting: Radiation Oncology

## 2014-11-16 ENCOUNTER — Ambulatory Visit
Admission: RE | Admit: 2014-11-16 | Discharge: 2014-11-16 | Disposition: A | Discharge status: Home / Self Care | Payer: Medicare Other | Source: Ambulatory Visit | Attending: Radiation Oncology | Admitting: Radiation Oncology

## 2014-11-16 DIAGNOSIS — C411 Malignant neoplasm of mandible: Secondary | ICD-10-CM

## 2014-11-16 DIAGNOSIS — Z510 Encounter for antineoplastic radiation therapy: Secondary | ICD-10-CM | POA: Diagnosis not present

## 2014-11-17 ENCOUNTER — Encounter: Payer: Self-pay | Admitting: Radiation Oncology

## 2014-11-17 ENCOUNTER — Other Ambulatory Visit: Payer: Self-pay | Admitting: Radiation Oncology

## 2014-11-17 ENCOUNTER — Ambulatory Visit
Admission: RE | Admit: 2014-11-17 | Discharge: 2014-11-17 | Disposition: A | Discharge status: Home / Self Care | Payer: Medicare Other | Source: Ambulatory Visit | Attending: Radiation Oncology | Admitting: Radiation Oncology

## 2014-11-17 ENCOUNTER — Encounter: Payer: Self-pay | Admitting: *Deleted

## 2014-11-17 DIAGNOSIS — Z510 Encounter for antineoplastic radiation therapy: Secondary | ICD-10-CM | POA: Diagnosis not present

## 2014-11-17 DIAGNOSIS — C411 Malignant neoplasm of mandible: Secondary | ICD-10-CM

## 2014-11-17 MED ORDER — LORAZEPAM 0.5 MG PO TABS: ORAL_TABLET | ORAL | Status: AC

## 2014-11-17 NOTE — Progress Notes (Addendum)
   Weekly Management Note:  Outpatient   C41.1 alveolar ridge cancer  Current Dose:  18Gy  Projected Dose: 30 Gy   Narrative:  The patient presents for routine under treatment assessment.  CBCT/MVCT images/Port film x-rays were reviewed.  The chart was checked.   Patient reports some trouble breathing during RT.  Daughter thinks patient is anxious.  Not SOB currently. Did better during RT today than yesterday. No other sx to suggest respiratory suppression.  Physical Findings:  Wt Readings from Last 3 Encounters:  11/17/14 166 lb 12.8 oz (75.66 kg)  11/15/14 166 lb 9.6 oz (75.569 kg)  10/27/14 159 lb (72.122 kg)     right facial swelling   Vitals with BMI 11/17/2014  Height   Weight 166 lbs 13 oz  BMI   Systolic 038  Diastolic 37  Pulse 76  Respirations   Vitals with Age-Percentiles 33/04/8327  Length   Systolic 191  Diastolic 37  MAP   Pulse 76  Respiration   Weight 75.66 kg  BMI   VISIT REPORT     CBC    Component Value Date/Time   WBC 13.2 08/12/2013   WBC 10.5 07/31/2013 1246   WBC 13.5* 12/07/2011 1248   RBC 4.09 07/31/2013 1246   RBC 4.17 12/07/2011 1248   HGB 11.9* 04/15/2014 1338   HGB 11.6* 12/07/2011 1248   HCT 35.0* 04/15/2014 1338   HCT 37.7 12/07/2011 1248   PLT 343 08/12/2013   MCV 87.8 07/31/2013 1246   MCV 90.5 12/07/2011 1248   MCH 28.6 07/31/2013 1246   MCH 27.8 12/07/2011 1248   MCHC 32.6 07/31/2013 1246   MCHC 30.8* 12/07/2011 1248   RDW 12.6 07/31/2013 1246     CMP     Component Value Date/Time   NA 140 04/15/2014 1338   NA 140 08/24/2013   K 4.3 04/15/2014 1338   CL 103 04/15/2014 1338   CO2 22 08/05/2013 0507   GLUCOSE 94 04/15/2014 1338   BUN 20 04/15/2014 1338   BUN 27* 08/24/2013   CREATININE 1.30* 04/15/2014 1338   CALCIUM 8.8 08/05/2013 0507   GFRNONAA 37* 08/05/2013 0507   GFRAA 43* 08/05/2013 0507     Impression:  The patient is tolerating radiotherapy.   Plan:  Continue radiotherapy as planned. Try 0.5 mg  ativan before RT for possible anxiety, as well as [place] O2. -----------------------------------  [name], MD

## 2014-11-17 NOTE — Progress Notes (Signed)
Ms. Socorro Kanitz is here because of difficultly with her radiation treatment. She reports the mask was very tight, and caused her to be short of breath while the mask was on and her oxygen saturation was 100% here in the exam room. Ms. Rapozo did report some aniexty about her radiation treatments. BP 138/37 mmHg  Pulse 76  Temp(Src) 98.8 F (37.1 C)  Wt 166 lb 12.8 oz (75.66 kg)  Wt Readings from Last 3 Encounters:  11/17/14 166 lb 12.8 oz (75.66 kg)  11/15/14 166 lb 9.6 oz (75.569 kg)  10/27/14 159 lb (72.122 kg)

## 2014-11-17 NOTE — Progress Notes (Signed)
  Oncology Nurse Navigator Documentation   Navigator Encounter Type: Treatment (11/17/14 1105) Patient Visit Type: GKBOQU (11/17/14 1105)     In follow-up to patient dtr's call this morning in which she reported her difficulty with RT d/t SOB when lying down, met with patient and dtr during tmt. Dtr expressed that anxiety is contributing her mother's difficulty. A 97% O2 sat was measure prior to tmt but 2L [place] was provided for reassurance.  I supported her between CT and tmt. She completed tmt without difficulty. I escorted her to Nursing for follow-up with Dr. Isidore Moos.  Gayleen Orem, RN, BSN, [place] at [place] [phone]                   Time Spent with Patient: 45 (11/17/14 1105)

## 2014-11-18 ENCOUNTER — Encounter: Payer: Self-pay | Admitting: *Deleted

## 2014-11-18 ENCOUNTER — Ambulatory Visit
Admission: RE | Admit: 2014-11-18 | Discharge: 2014-11-18 | Disposition: A | Discharge status: Home / Self Care | Payer: Medicare Other | Source: Ambulatory Visit | Attending: Radiation Oncology | Admitting: Radiation Oncology

## 2014-11-18 DIAGNOSIS — Z510 Encounter for antineoplastic radiation therapy: Secondary | ICD-10-CM | POA: Diagnosis not present

## 2014-11-18 NOTE — Progress Notes (Signed)
[place] Work  Clinical Social Work was referred by Pension scheme manager for assessment of psychosocial needs.  Clinical Social Worker contacted patient's daughter by phone to offer support and assess for needs.  Patient's daughter, Levada Dy, stated her mother is very tired when at cancer center and may not be able to engage in [place] session.  CSW spoke briefly with patient's daughter and agreed on "check in" on Monday after radiation oncology appointment.   Polo Riley, MSW, LCSW, OSW-C Clinical Social Worker [facility] [phone]

## 2014-11-18 NOTE — Progress Notes (Signed)
  Oncology Nurse Navigator Documentation   Navigator Encounter Type: Treatment (11/18/14 1235) Patient Visit Type: FHLKTG (11/18/14 1235)     Provided patient support and encouragement during RT. She reported she had taken "nerve and pain" medication prior to arrival. She was provided 2L O2 by [place] for additional psychological support. She completed tmt without incident. She expressed appreciation for my support.  Gayleen Orem, RN, BSN, [place] at [place] [phone]                   Time Spent with Patient: 45 (11/18/14 1235)

## 2014-11-19 ENCOUNTER — Ambulatory Visit
Admission: RE | Admit: 2014-11-19 | Discharge: 2014-11-19 | Disposition: A | Discharge status: Home / Self Care | Payer: Medicare Other | Source: Ambulatory Visit | Attending: Radiation Oncology | Admitting: Radiation Oncology

## 2014-11-19 ENCOUNTER — Encounter: Payer: Self-pay | Admitting: *Deleted

## 2014-11-19 DIAGNOSIS — Z510 Encounter for antineoplastic radiation therapy: Secondary | ICD-10-CM | POA: Diagnosis not present

## 2014-11-22 ENCOUNTER — Encounter: Payer: Self-pay | Admitting: Radiation Oncology

## 2014-11-22 ENCOUNTER — Encounter: Payer: Self-pay | Admitting: *Deleted

## 2014-11-22 ENCOUNTER — Ambulatory Visit
Admission: RE | Admit: 2014-11-22 | Discharge: 2014-11-22 | Disposition: A | Discharge status: Home / Self Care | Payer: Medicare Other | Source: Ambulatory Visit | Attending: Radiation Oncology | Admitting: Radiation Oncology

## 2014-11-22 ENCOUNTER — Other Ambulatory Visit: Payer: Self-pay | Admitting: Radiation Oncology

## 2014-11-22 VITALS — BP 136/81 | HR 72 | Temp 98.0°F | Ht 63.0 in | Wt 161.8 lb

## 2014-11-22 DIAGNOSIS — C411 Malignant neoplasm of mandible: Secondary | ICD-10-CM

## 2014-11-22 DIAGNOSIS — Z510 Encounter for antineoplastic radiation therapy: Secondary | ICD-10-CM | POA: Diagnosis not present

## 2014-11-22 NOTE — Progress Notes (Addendum)
Ms. Mary [name] presents for her 9th fraction of radiation to her Right mandible. She reports fatigue, is able to complete some activities, but her daughter assists her also. She does report taking more naps than she normally does. She complains of swallowing difficulties. She is taking in soft foods, and is unable to eat spicy or acidic foods because of pain. She is not taking her hydrocodone because of itching. Her cheek is red on the outside and inside and she reports tenderness inside and out, rating it at a 5 on a scale of 0-10. Wt Readings from Last 3 Encounters:  11/22/14 161 lb 12.8 oz (73.392 kg)  11/17/14 166 lb 12.8 oz (75.66 kg)  11/15/14 166 lb 9.6 oz (75.569 kg)  BP 136/81 mmHg  Pulse 72  Temp(Src) 98 F (36.7 C)  Ht 5\' 3"  (1.6 m)  Wt 161 lb 12.8 oz (73.392 kg)  BMI 28.67 kg/m2  Her sitting BP was 136/81 and pulse 72. Her standing BP was 119/61 and her pulse was 76

## 2014-11-22 NOTE — Progress Notes (Signed)
   Weekly Management Note:  Outpatient    ICD-9-CM ICD-10-CM   1. 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  CBCT/MVCT images/Port film x-rays were reviewed.  The chart was checked.  Mary [name] presents for her 9th fraction of radiation to her Right mandible. She reports fatigue, is able to complete some activities, but her daughter assists her also. She does report taking more naps than she normally does. She complains of swallowing difficulties. She is taking in soft foods, and is unable to eat spicy or acidic foods because of pain. She is not taking her hydrocodone because of itching. Her cheek is red on the outside and inside and she reports tenderness inside and out, rating it at a 5 on a scale of 0-10. Cannot tolerate the narcotic Rx so taking OTC pain meds.     Her sitting BP was 136/81 and pulse 72. Her standing BP was 119/61 and her pulse was 76  Physical Findings:  height is 5\' 3"  (1.6 m) and weight is 161 lb 12.8 oz (73.392 kg). Her temperature is 98 F (36.7 C). Her blood pressure is 136/81 and her pulse is 72.   Wt Readings from Last 3 Encounters:  11/22/14 161 lb 12.8 oz (73.392 kg)  11/17/14 166 lb 12.8 oz (75.66 kg)  11/15/14 166 lb 9.6 oz (75.569 kg)   Oral cavity- mucositis over right tongue, trismus, no thrush  CMP     Component Value Date/Time   NA 140 04/15/2014 1338   NA 140 08/24/2013   K 4.3 04/15/2014 1338   CL 103 04/15/2014 1338   CO2 22 08/05/2013 0507   GLUCOSE 94 04/15/2014 1338   BUN 20 04/15/2014 1338   BUN 27* 08/24/2013   CREATININE 1.30* 04/15/2014 1338   CALCIUM 8.8 08/05/2013 0507   GFRNONAA 37* 08/05/2013 0507   GFRAA 43* 08/05/2013 0507     Impression:  The patient is tolerating radiotherapy.  Plan:  Continue radiotherapy as planned. Push PO intake. Tylenol or advil for pain.  Check BMP tomorrow before RT.  This will help determine if IV fluids are warranted to supplement PO intake.  Ordering followup to be in 1 week. ________________________________   [name], M.D.

## 2014-11-23 ENCOUNTER — Telehealth: Payer: Self-pay | Admitting: *Deleted

## 2014-11-23 ENCOUNTER — Encounter: Payer: Self-pay | Admitting: Radiation Oncology

## 2014-11-23 ENCOUNTER — Ambulatory Visit
Admission: RE | Admit: 2014-11-23 | Discharge: 2014-11-23 | Disposition: A | Discharge status: Home / Self Care | Payer: Medicare Other | Source: Ambulatory Visit | Attending: Radiation Oncology | Admitting: Radiation Oncology

## 2014-11-23 ENCOUNTER — Encounter: Payer: Self-pay | Admitting: *Deleted

## 2014-11-23 ENCOUNTER — Other Ambulatory Visit: Payer: Self-pay | Admitting: Radiation Oncology

## 2014-11-23 DIAGNOSIS — C411 Malignant neoplasm of mandible: Secondary | ICD-10-CM

## 2014-11-23 DIAGNOSIS — Z510 Encounter for antineoplastic radiation therapy: Secondary | ICD-10-CM | POA: Diagnosis not present

## 2014-11-23 LAB — BASIC METABOLIC PANEL (CC13)
ANION GAP: 10 meq/L (ref 3–11)
BUN: 28.9 mg/dL — ABNORMAL HIGH (ref 7.0–26.0)
CO2: 28 mEq/L (ref 22–29)
Calcium: 9.2 mg/dL (ref 8.4–10.4)
Chloride: 104 mEq/L (ref 98–109)
Creatinine: 1.5 mg/dL — ABNORMAL HIGH (ref 0.6–1.1)
EGFR: 32 mL/min/{1.73_m2} — AB (ref >90)
GLUCOSE: 111 mg/dL (ref 70–140)
POTASSIUM: 4.9 meq/L (ref 3.5–5.1)
Sodium: 142 mEq/L (ref 136–145)

## 2014-11-23 NOTE — Progress Notes (Signed)
  Oncology Nurse Navigator Documentation   Navigator Encounter Type: Treatment (11/19/14 1030) Patient Visit Type: Radonc (11/19/14 1030)     To provide support and encouragement, met with patient during LINAC 3 tmt.  Her dtr accompanied her. She reported she had taken pain and anxiety medication prior to arrival. She completed tmt without difficulty.  Gayleen Orem, RN, BSN, [place] at [place] [phone]                   Time Spent with Patient: 30 (11/19/14 1030)

## 2014-11-23 NOTE — Telephone Encounter (Signed)
Called patient to ask about coming in for labs today, patient agreed to come in at 3:15 pm today

## 2014-11-23 NOTE — Progress Notes (Signed)
CMP     Component Value Date/Time   NA 142 11/23/2014 1520   NA 140 04/15/2014 1338   NA 140 08/24/2013   K 4.9 11/23/2014 1520   K 4.3 04/15/2014 1338   CL 103 04/15/2014 1338   CO2 28 11/23/2014 1520   CO2 22 08/05/2013 0507   GLUCOSE 111 11/23/2014 1520   GLUCOSE 94 04/15/2014 1338   BUN 28.9* 11/23/2014 1520   BUN 20 04/15/2014 1338   BUN 27* 08/24/2013   CREATININE 1.5* 11/23/2014 1520   CREATININE 1.30* 04/15/2014 1338   CALCIUM 9.2 11/23/2014 1520   CALCIUM 8.8 08/05/2013 0507   GFRNONAA 37* 08/05/2013 0507   GFRAA 43* 08/05/2013 0507   Labs reviewed today.  Patient may be a bit dehydrated.  I called and asked the patient and her daughter to make an appointment with her PCP to recheck vitals and determine how to modulate her lasix given her poor PO intake during oral cavity RT. I told her to decrease lasix to 10 mg PO daily until he sees him.  I will follow up with her in a week.   I asked my staff to please fax my notes including today's "documentation" note  to Thayer Jew, MD. Please also fax pt's labs from the past 3 months. Also to please call Dr. Doristine Devoid nurse know about the circumstances above. -----------------------------------  [name], MD

## 2014-11-23 NOTE — Progress Notes (Signed)
  Oncology Nurse Navigator Documentation   Navigator Encounter Type: Clinic/MDC;Treatment (11/22/14 1205) Patient Visit Type: Radonc (11/22/14 1205)     To provide support and encouragement, care continuity and to assess for needs, met with patient during scheduled RT and afterword during WUT with Dr. Isidore [name].  Her dtr Mary [name] accompanied her. She tolerated tmt without difficulty, in part because she took Ativan shortly before tmt.    She acknowledged tomorrow's final tmt. Dtr commented that her anxiety is making it difficult for her to sleep at HS, asked if taking Ativan before bedtime might help.   I indicated worth trying since it is relaxing her enough to make it through her RTs. Patient and dtr met with LCSW Polo Riley prior to RT, Lauren to follow-up with both of them.  Gayleen Orem, RN, BSN, [place] at [place] [phone]                    Time Spent with Patient: 48 (11/22/14 1205)

## 2014-11-24 ENCOUNTER — Ambulatory Visit: Payer: Medicare Other

## 2014-11-24 ENCOUNTER — Telehealth: Payer: Self-pay | Admitting: *Deleted

## 2014-11-24 NOTE — Telephone Encounter (Signed)
CALLED PATIENT TO INFORM OF LAB ON 12-01-14 @ 11 AM AND FU WITH DR. Isidore Moos ON 12-01-14 @ 11:40 AM, SPOKE WITH HER DAUGHTER ANGELA AND SHE IS AWARE OF THESE APPTS.

## 2014-11-24 NOTE — Progress Notes (Signed)
[place] Work  Clinical Social Work was referred by Pension scheme manager for assessment of psychosocial needs.  Clinical Social Worker met with patient and patient's daughter in radiation oncology treatment area. Ms. Kendle articulated her feelings related to treatment (discussed depression, anxiety, sleeplessness) and expressed hope that her physical health would improve once she finished radiation treatment.  CSW strongly encouraged the patient to meet with CSW for individual counseling once she finishes treatment. CSW met with patient's daughter while patient received treatment.  Patient's daughter shared she has taken a leave of absence from work and may continue to stay with patient after treatment.  CSW provided brief emotional support and discussed possible resources.  CSW encouraged patient's daughter to contact CSW to set up counseling for patient and support for daughter.    Polo Riley, MSW, LCSW, OSW-C Clinical Social Worker [facility] [phone]

## 2014-11-25 ENCOUNTER — Telehealth: Payer: Self-pay | Admitting: *Deleted

## 2014-11-25 ENCOUNTER — Other Ambulatory Visit: Payer: Self-pay | Admitting: Radiation Oncology

## 2014-11-25 DIAGNOSIS — C411 Malignant neoplasm of mandible: Secondary | ICD-10-CM

## 2014-11-25 MED ORDER — TRAMADOL HCL 50 MG PO TABS: 50.0000 mg | ORAL_TABLET | Freq: Two times a day (BID) | ORAL | Status: AC | PRN

## 2014-11-25 NOTE — Telephone Encounter (Signed)
Prescription printed for Tramadol 50 mg q 12h prn. Disp# 30 tabs, no refills.

## 2014-11-25 NOTE — Telephone Encounter (Signed)
  Oncology Nurse Navigator Documentation   Navigator Encounter Type: Telephone (11/25/14 1122)         Interventions: Medication assitance (11/25/14 1122)     Patient dtr called to report her mother needs Rx for pain.  She reported:  Pain in R mouth area, new onset since completion of RT on Tuesday.  Severe, kept her awake last HS.  Advil and lidocaine rinse do not resolve, hydrocodone causes itching.  She has taken tramadol in past with success.  I notified Dr. Valere Dross in Dr. Pearlie Oyster absence, indicated I would follow-up with her.  Gayleen Orem, RN, BSN, [place] at [place] [phone]     S         Time Spent with Patient: 15 (11/25/14 1122)

## 2014-11-25 NOTE — Telephone Encounter (Signed)
  Oncology Nurse Navigator Documentation   Navigator Encounter Type: Telephone (11/25/14 1346)         Interventions: Medication assitance (11/25/14 1346)       Patient dtr notified tramadol Rx available for pick-up in [place].  She verbalized understanding.  Gayleen Orem, RN, BSN, [place] at [place] [phone]         Time Spent with Patient: 15 (11/25/14 1346)

## 2014-11-25 NOTE — Progress Notes (Signed)
  Oncology Nurse Navigator Documentation   Navigator Encounter Type: Treatment (11/23/14 1540) Patient Visit Type: HYIFOY (11/23/14 1540) Treatment Phase: Final Radiation Tx (11/23/14 1540)     Met with pt during final RT to offer support and to celebrate end of radiation treatment.  She was accompanied by her dtr Levada Dy. I provided dtr with a Certificate of Recognition for her supportive care during her mother's tmt. I explained that my role as navigator will continue for several more months and that I will be calling and/or joining her during follow-up visits.   I encouraged her to call me with needs/concerns.   Patient and dtr verbalized understanding of information provided.  Gayleen Orem, RN, BSN, [place] [facility] [place] [phone]                   Time Spent with Patient: 30 (11/23/14 1540)

## 2014-11-28 NOTE — Progress Notes (Signed)
  Radiation Oncology         (336) [phone] ________________________________  Name: Mary [name] MRN: [id number]  Date: 11/23/2014  DOB: 07-29-1925  End of Treatment Note  DIAGNOSIS:    ICD-9-CM ICD-10-CM   1. 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      Plan: The patient has completed radiation treatment. The patient will return to radiation oncology clinic for routine followup in one week. I advised them to call or return sooner if they have any questions or concerns related to their recovery or treatment.  -----------------------------------  [name], MD

## 2014-11-30 NOTE — Progress Notes (Addendum)
Pain issues, if any: Her pain is on the right side of face from her lower cheek to her lip, and inside the right corner of her lip. She is taking ultram 50mg , 2-3 pills a day, especially at night, and she uses tylenol in between to supplement the ultram. She is also using ativan at night to help her sleep better.  Using a feeding tube?: No Weight changes, if any   Wt Readings from Last 3 Encounters:  12/01/14 154 lb (69.854 kg)  11/22/14 161 lb 12.8 oz (73.392 kg)  11/17/14 166 lb 12.8 oz (75.66 kg)   Swallowing issues, if any: She reports a painful spot in her throat which is painful and hinders her swallowing ability. She is taking in soft foods (mashed potatoes, oatmeal etc) due to swallowing difficulties. She is drinking a nutritional supplement (equate), but her daughter reports only 1 can a day at this time. The patient often declines food because "she doesn't feel hungry". Smoking or chewing tobacco? No Using fluoride trays daily? Yes, she uses a prescription fluoride toothpaste on the right side of her mouth only.  Last ENT visit was on: She reports in early August before her surgery.  Other notable issues, if any: She reports fatigue and naps during the day at times. She is working with a physical therapist at home. Her skin is red at her lower right cheek and going towards her chin. Her right lip is bleeding scant amount of blood, which she wipes with a tissue as needed. The inside of her right cheek is red, and swollen.   BP 96/48 mmHg  Pulse 77  Temp(Src) 98.9 F (37.2 C)  Ht 5\' 3"  (1.6 m)  Wt 154 lb (69.854 kg)  BMI 27.29 kg/m2  SpO2 99%  Orthostatic vitals: Sitting 116/53, pulse 75 and standing 95/48, pulse 77.

## 2014-12-01 ENCOUNTER — Ambulatory Visit (HOSPITAL_BASED_OUTPATIENT_CLINIC_OR_DEPARTMENT_OTHER)
Admission: RE | Admit: 2014-12-01 | Discharge: 2014-12-01 | Disposition: A | Discharge status: Home / Self Care | Payer: Medicare Other | Source: Ambulatory Visit | Attending: Radiation Oncology | Admitting: Radiation Oncology

## 2014-12-01 ENCOUNTER — Ambulatory Visit
Admission: RE | Admit: 2014-12-01 | Discharge: 2014-12-01 | Disposition: A | Discharge status: Home / Self Care | Payer: Medicare Other | Source: Ambulatory Visit | Attending: Radiation Oncology | Admitting: Radiation Oncology

## 2014-12-01 ENCOUNTER — Encounter: Payer: Self-pay | Admitting: *Deleted

## 2014-12-01 VITALS — BP 96/48 | HR 77 | Temp 98.9°F | Ht 63.0 in | Wt 154.0 lb

## 2014-12-01 DIAGNOSIS — C411 Malignant neoplasm of mandible: Secondary | ICD-10-CM

## 2014-12-01 LAB — BASIC METABOLIC PANEL (CC13)
Anion Gap: 8 mEq/L (ref 3–11)
BUN: 19.5 mg/dL (ref 7.0–26.0)
CHLORIDE: 106 meq/L (ref 98–109)
CO2: 26 mEq/L (ref 22–29)
Calcium: 9.5 mg/dL (ref 8.4–10.4)
Creatinine: 1.3 mg/dL — ABNORMAL HIGH (ref 0.6–1.1)
EGFR: 38 mL/min/{1.73_m2} — AB (ref >90)
GLUCOSE: 107 mg/dL (ref 70–140)
POTASSIUM: 4.4 meq/L (ref 3.5–5.1)
Sodium: 141 mEq/L (ref 136–145)

## 2014-12-01 NOTE — Progress Notes (Signed)
Radiation Oncology         (336) [phone] ________________________________  Name: Mary [name] MRN: [id number]  Date: 12/01/2014  DOB: Mar 30, 1925  Follow-Up Visit Note  Outpatient  CC: Thressa Sheller, MD  Philomena Doheny, MD  Diagnosis and Prior Radiotherapy:    ICD-9-CM ICD-10-CM   1. 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    Pain issues, if any: Her pain is slowly improving; it is on the right side of face from her lower cheek to her lip, and inside the right corner of her lip. She is taking ultram 66m, 2-3 pills a day, especially at night, and she uses tylenol in between to supplement the ultram. She is also using ativan at night to help her sleep better.  Using a feeding tube?: No   Weight changes, if any   Wt Readings from Last 3 Encounters:   12/01/14  154 lb (69.854 kg)   11/22/14  161 lb 12.8 oz (73.392 kg)   11/17/14  166 lb 12.8 oz (75.66 kg)    Swallowing issues, if any: She reports a painful spot in her throat which is painful and hinders her swallowing ability. She is taking in soft foods (mashed potatoes, oatmeal etc) due to swallowing difficulties. She is drinking a nutritional supplement (equate), but her daughter reports only 1 can a day at this time. The patient often declines food because "she doesn't feel hungry".  Not lightheaded with standing. Smoking or chewing tobacco? No  Using fluoride trays daily? Yes, she uses a prescription fluoride toothpaste on the right side of her mouth only.  Last ENT visit was on: She  reports in early August before her surgery.  Other notable issues, if any: She reports fatigue and naps during the day at times. She is working with a physical therapist at home. Her skin is red at her lower right cheek and going towards her chin. Her right lip is bleeding scant amount of blood, which she wipes with a tissue as needed. The inside of her right cheek is red, and swollen.  She is concerned with her next steps of the treatment process and her limitations and ability to stay home alone.  BP 96/48 mmHg  Pulse 77  Temp(Src) 98.9 F (37.2 C)  Ht 5' 3"  (1.6 m)  Wt 154 lb (69.854 kg)  BMI 27.29 kg/m2  SpO2 99%  Orthostatic vitals: Sitting 116/53, pulse 75 and standing 95/48, pulse 77.                     ALLERGIES:  has No Known Allergies.  Meds: Current Outpatient Prescriptions  Medication Sig Dispense Refill  . acetaminophen (TYLENOL) 500 MG tablet Take 500 mg by mouth every 6 (six) hours as needed for moderate pain.    .Marland Kitchenaspirin 81 MG tablet Take 81 mg by mouth daily.    .Marland Kitchenatorvastatin (LIPITOR) 10 MG tablet Take 10 mg by mouth daily.    . Calcium-Vitamin D-Vitamin K (VIACTIV PO) Take by mouth daily.    .Marland Kitchen  Cholecalciferol (VITAMIN D-3 PO) Take 5,000 Int'l Units by mouth 1 day or 1 dose.    . docusate sodium (COLACE) 100 MG capsule Take 1 capsule (100 mg total) by mouth 2 (two) times daily. Continue this while taking narcotics to help with bowel movements 30 capsule 1  . furosemide (LASIX) 20 MG tablet Take 10 mg by mouth daily.     Marland Kitchen gabapentin (NEURONTIN) 300 MG capsule Take 300 mg by mouth daily as needed (pain).     Marland Kitchen ibuprofen (ADVIL,MOTRIN) 200 MG tablet Take 200 mg by mouth every 6 (six) hours as needed.    Marland Kitchen levothyroxine (SYNTHROID, LEVOTHROID) 50 MCG tablet Take 50 mcg by mouth daily before breakfast.    . lidocaine (XYLOCAINE) 2 % solution Patient: Mix 1part 2% viscous lidocaine, 1part H20. Swish and/or swallow 43m of this mixture, 370m before meals and at bedtime,  up to QID 100 mL 5  . LORazepam (ATIVAN) 0.5 MG tablet Take 1 tablet 30-45 minutes before radiotherapy (Patient taking differently: 0.5 mg at bedtime. Changed by primary MD to take at night as needed.) 4 tablet 0  . losartan (COZAAR) 100 MG tablet Take 100 mg by mouth daily.    . methocarbamol (ROBAXIN) 500 MG tablet Take 1 tablet (500 mg total) by mouth every 6 (six) hours as needed. 40 tablet 1  . Multiple Vitamin (MULTIVITAMIN) capsule Take 1 capsule by mouth daily.    . Marland Kitchenmega-3 acid ethyl esters (LOVAZA) 1 G capsule Take 1 g by mouth daily.    . polyethylene glycol (MIRALAX / GLYCOLAX) packet Take 17 g by mouth daily as needed for moderate constipation.    . psyllium (REGULOID) 0.52 G capsule Take 0.52 g by mouth daily as needed (constipation).    . sodium fluoride (PREVIDENT 5000 PLUS) 1.1 % CREA dental cream Apply to tooth brush. Brush teeth for 2 minutes. Spit out excess-DO NOT swallow. Repeat nightly. 1 Tube prn  . traMADol (ULTRAM) 50 MG tablet Take 1 tablet (50 mg total) by mouth every 12 (twelve) hours as needed. (Patient taking differently: Take 50 mg by mouth every 6 (six) hours as needed (changed by primary MD to every 6 hours prn). ) 30 tablet 0  . verapamil (COVERA HS) 240 MG (CO) 24 hr tablet Take 240 mg by mouth at bedtime.    . Marland KitchenYDROcodone-acetaminophen (NORCO/VICODIN) 5-325 MG per tablet Take 1 tablet by mouth every 4 (four) hours as needed for moderate pain. (Patient not taking: Reported on 11/15/2014) 60 tablet 0   No current facility-administered medications for this encounter.    Physical Findings: The patient is in no acute distress. Patient is alert and oriented.  height is 5' 3"  (1.6 m) and weight is 154 lb (69.854 kg). Her temperature is 98.9 F (37.2 C). Her blood pressure is 96/48 and her pulse is 77. Her oxygen saturation is 99%. . Marland Kitchen   HEENT: Bleeding and moist desquamation over right lip; the exophytic tissue on the right alveolar ridge appears less swollen Skin:  Skin over right face in treatment fields is intact but erythematous   Lab Findings: Lab Results  Component Value Date   WBC 13.2 08/12/2013   HGB 11.9* 04/15/2014   HCT 35.0* 04/15/2014   MCV 87.8 07/31/2013   PLT 343 08/12/2013   CMP     Component Value Date/Time   NA 141 12/01/2014 1128   NA 140 04/15/2014 1338   NA 140 08/24/2013   K 4.4 12/01/2014 1128  K 4.3 04/15/2014 1338   CL 103 04/15/2014 1338   CO2 26 12/01/2014 1128   CO2 22 08/05/2013 0507   GLUCOSE 107 12/01/2014 1128   GLUCOSE 94 04/15/2014 1338   BUN 19.5 12/01/2014 1128   BUN 20 04/15/2014 1338   BUN 27* 08/24/2013   CREATININE 1.3* 12/01/2014 1128   CREATININE 1.30* 04/15/2014 1338   CALCIUM 9.5 12/01/2014 1128   CALCIUM 8.8 08/05/2013 0507   GFRNONAA 37* 08/05/2013 0507   GFRAA 43* 08/05/2013 0507      Radiographic Findings: No results found.  Impression/Plan: She tolerated RT relatively well and is healing from expected side effects. We talked about her guarded prognosis, and ways for her to move forward in a positive way as she deals with the unknown. She has met with Polo Riley, of SW.  Her daughter leaves town on 10-22.  Patient wants to remain independent as long as she can. Refer to [facility] so she can have close attention/support and maximization of QOL.  Follow up in December with me, sooner if needed.  BMP Labs are satisfactory, She is slightly orthostatic but not symptomatic. Push PO intake.    This document serves as a record of services personally performed by [name], MD. It was created on her behalf by Janace Hoard, a trained medical scribe. The creation of this record is based on the scribe's personal observations and the provider's statements to them. This document has been checked and approved by the attending provider.  _____________________________________   [name], MD

## 2014-12-01 NOTE — Progress Notes (Signed)
[facility]  Clinical Social Work met with patient in [place] office for brief counseling.  Patient reported difficulty sleeping and situational anxiety.  Patient shared she has racing thoughts in the evening and she is unable to clear her mind.  CSW and patient explored thoughts related to side effects of surgery and radiation treatment, anxiety around losing her independence, and she indicated her greatest fear is of the uncertainty related to her medical condition.  Ms. Minton verbalized what she needed most from her family and medical team was for them to open and honest with her about her condition, what they need from her, and about her experience overall.  CSW and patient discussed coping skills to address anxiety and difficulty sleeping- patient plans to utilize the cancer center's meditation CD as a tool to help clear patient's mind before sleeping.  CSW and patient discussed outpatient palliative care consult and CSW encouraged patient to direct medical questions to her radiation oncologist at their next appointment. CSW encouraged patient to contact CSW to schedule another counseling session as needed.   Polo Riley, MSW, LCSW, OSW-C Clinical Social Worker [facility] [phone]

## 2014-12-02 NOTE — Progress Notes (Signed)
  Oncology Nurse Navigator Documentation   Navigator Encounter Type: Clinic/MDC (12/01/14 1220) Patient Visit Type: Radonc;Follow-up (12/01/14 1220)     To provide support and encouragement, care continuity and to assess for needs, met with patient during 1 week post-RT f/u with Dr. Isidore Moos.  Her dtr Levada Dy accompanied her. She reported:  Mouth pain resolving.  Having some difficulty sleeping at HS but tramadol and Ativan helping her sleep better.  R lower lip still tender, sore to touch.  Dr. Isidore Moos noted healing will improve in next couple of weeks, encouraged topical application of lidocaine gel. Levada Dy indicated she is going home to [place] this weekend for 3 weeks, expressed concern for her mom's wellbeing in her absence. Dr. Isidore Moos discussed:  Option of palliative care home service with [facility].  Both patient and dtr agreed this support would be helpful during her tmt recovery, agreed to referral being made.  Encouraged her to make a list of activities she looks forward to doing as she recovers from tmt.  Goal of maintaining weight, encouraged eating as able high protein foods such as eggs, greek yogurt. Patient and dtr verbalized understanding of information/guidance provided. They understand I can be contacted with needs/concerns.  Gayleen Orem, RN, BSN, [place] [facility] [place] [phone]                    Time Spent with Patient: 30 (12/01/14 1220)

## 2014-12-20 ENCOUNTER — Encounter: Payer: Self-pay | Admitting: *Deleted

## 2014-12-20 NOTE — Progress Notes (Addendum)
Spoke with Catalina Antigua OT from [facility].  Reporting findings from todays visit.  He noted swelling to her face right side of her face and an "abscess" to the right cheek.  She reported pain of 7/10,  With continued poor appetite for the last 2 weeks. Called Altha Harm NP from hospsice and palliative care.  He reports the family states she has increased swelling to the right side of her face and he noted two "white" heads/superficial folliculitis.  He reports her pain is increased and he has adjusted her medication. Scheduled pt for a follow up appointment this Wednesday with Dr. Isidore Moos.

## 2014-12-22 ENCOUNTER — Ambulatory Visit
Admission: RE | Admit: 2014-12-22 | Discharge: 2014-12-22 | Disposition: A | Discharge status: Home / Self Care | Payer: Medicare Other | Source: Ambulatory Visit | Attending: Radiation Oncology | Admitting: Radiation Oncology

## 2014-12-22 ENCOUNTER — Encounter: Payer: Self-pay | Admitting: Radiation Oncology

## 2014-12-22 VITALS — BP 112/57 | HR 73 | Temp 98.0°F | Wt 155.0 lb

## 2014-12-22 DIAGNOSIS — C411 Malignant neoplasm of mandible: Secondary | ICD-10-CM

## 2014-12-22 MED ORDER — CEPHALEXIN 500 MG PO CAPS: 500.0000 mg | ORAL_CAPSULE | Freq: Three times a day (TID) | ORAL | Status: AC

## 2014-12-22 NOTE — Progress Notes (Signed)
Mary [name] is here for follow up of radiation completed 11/23/14 to the Right floor of her mouth. She presents today with several concerns. She has an area to the right of her chin that appeared as a white bump and got larger and tender for Mary [name]. It has improved today and presents as a very small raised white area. They are concerned about swelling below her chin, that has increased since completing radiation.   Pain issues, if any: She has sharp pains that are from her Right ear to her chin at times that are severe. She is taking neurontin 100mg  twice during the day, and 300mg  at bedtime along with ultram at bedtime. She states this has helped somewhat, but still states the pain comes at times. She also complains of a burning sensation in her mouth that is intermittent also.  Using a feeding tube?: No Weight changes, if any:  Wt Readings from Last 3 Encounters:  12/22/14 155 lb (70.308 kg)  12/01/14 154 lb (69.854 kg)  11/22/14 161 lb 12.8 oz (73.392 kg)   Swallowing issues, if any: She is eating soft foods that she does not have to chew, because she has trouble chewing. She reports she is not able to taste foods well anymore. She has a decreased appetite.  Smoking or chewing tobacco? No Using fluoride trays daily? She is using a special [employer] product on the Right side of her mouth.  Last ENT visit was on: None

## 2014-12-22 NOTE — Progress Notes (Signed)
Radiation Oncology         [phone] ________________________________  Name: Mary [name] MRN: [id number]  Date: 12/22/2014  DOB: 23-Sep-1925  Follow-Up Visit Note  Outpatient  CC: Thressa Sheller, MD  Mary Doheny, MD  Diagnosis and Prior Radiotherapy:    ICD-9-CM ICD-10-CM   1. 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 She is currently being followed by palliative care with home visits, she sees Billey Chang, NP.   She presents today with several concerns. She has an area to the right of her chin that appeared as a white bump and got larger and tender for Mary [name]. It first appeared this past Friday. It has improved today and presents as a very small raised white area. They are concerned about swelling below her chin, that has increased since completing radiation.   She has sharp pains that are from her Right ear to her chin at times that are severe. She is taking neurontin 100mg  twice during the day, and 300mg  at bedtime along with ultram at bedtime. She states this has helped somewhat, but still states the pain comes at times. She also complains of a burning sensation in her mouth that is intermittent also. She describes the pain is more in the cheek and radiates to her right ear. She notes that it is very excruciating when it comes to the ear. She was not started on any antibiotics. Her daughter notes originally there were two spots that looked like pimples. Over time they joined together creating one large area.  She is  eating soft foods that she does not have to chew, because she has trouble chewing. She reports she is not able to taste foods well anymore. She has a decreased appetite. Denies smoking or chewing tobacco use. She is using a special [employer] product on the Right side of her mouth. She states some days she feels really good and other days are bad. Monday was a bad day, with no urge to eat or drink, and all she could do was sleep. She wonders if there if a cream to help with "rough patches" on her skin.                     ALLERGIES:  has No Known Allergies.  Meds: Current Outpatient Prescriptions  Medication Sig Dispense Refill  . acetaminophen (TYLENOL) 500 MG tablet Take 500 mg by mouth every 6 (six) hours as needed for moderate pain.    Marland Kitchen aspirin 81 MG tablet Take 81 mg by mouth daily.    Marland Kitchen atorvastatin (LIPITOR) 10 MG tablet Take 10 mg by mouth daily.    . Calcium-Vitamin D-Vitamin K (VIACTIV PO) Take by mouth daily.    . Cholecalciferol (VITAMIN D-3 PO) Take 5,000 Int'l Units by mouth 1 day or 1 dose.    . docusate sodium (COLACE) 100 MG capsule Take 1 capsule (100 mg total) by mouth 2 (two) times daily. Continue this while taking narcotics to help with bowel movements 30 capsule 1  .  furosemide (LASIX) 20 MG tablet Take 10 mg by mouth daily.     Marland Kitchen gabapentin (NEURONTIN) 100 MG capsule Take 100 mg by mouth 2 (two) times daily.    Marland Kitchen gabapentin (NEURONTIN) 300 MG capsule Take 300 mg by mouth daily as needed (pain).     Marland Kitchen levothyroxine (SYNTHROID, LEVOTHROID) 50 MCG tablet Take 50 mcg by mouth daily before breakfast.    . lidocaine (XYLOCAINE) 2 % solution Patient: Mix 1part 2% viscous lidocaine, 1part H20. Swish and/or swallow 33mL of this mixture, 58min before meals and at bedtime, up to QID 100 mL 5  . LORazepam (ATIVAN) 0.5 MG tablet Take 1 tablet 30-45 minutes before radiotherapy (Patient taking differently: 0.5 mg at bedtime. Changed by primary MD to take at night as needed.) 4 tablet 0  .  losartan (COZAAR) 100 MG tablet Take 100 mg by mouth daily.    . methocarbamol (ROBAXIN) 500 MG tablet Take 1 tablet (500 mg total) by mouth every 6 (six) hours as needed. 40 tablet 1  . Multiple Vitamin (MULTIVITAMIN) capsule Take 1 capsule by mouth daily.    Marland Kitchen omega-3 acid ethyl esters (LOVAZA) 1 G capsule Take 1 g by mouth daily.    . polyethylene glycol (MIRALAX / GLYCOLAX) packet Take 17 g by mouth daily as needed for moderate constipation.    . psyllium (REGULOID) 0.52 G capsule Take 0.52 g by mouth daily as needed (constipation).    . sodium fluoride (PREVIDENT 5000 PLUS) 1.1 % CREA dental cream Apply to tooth brush. Brush teeth for 2 minutes. Spit out excess-DO NOT swallow. Repeat nightly. 1 Tube prn  . traMADol (ULTRAM) 50 MG tablet Take 1 tablet (50 mg total) by mouth every 12 (twelve) hours as needed. (Patient taking differently: Take 50 mg by mouth every 6 (six) hours as needed (changed by primary MD to every 6 hours prn). ) 30 tablet 0  . verapamil (COVERA HS) 240 MG (CO) 24 hr tablet Take 240 mg by mouth at bedtime.    Marland Kitchen HYDROcodone-acetaminophen (NORCO/VICODIN) 5-325 MG per tablet Take 1 tablet by mouth every 4 (four) hours as needed for moderate pain. (Patient not taking: Reported on 11/15/2014) 60 tablet 0  . ibuprofen (ADVIL,MOTRIN) 200 MG tablet Take 200 mg by mouth every 6 (six) hours as needed.     No current facility-administered medications for this encounter.    Physical Findings: Wt Readings from Last 3 Encounters:  12/22/14 155 lb (70.308 kg)  12/01/14 154 lb (69.854 kg)  11/22/14 161 lb 12.8 oz (73.392 kg)    The patient is in no acute distress. Patient is alert and oriented.  weight is 155 lb (70.308 kg). Her temperature is 98 F (36.7 C). Her blood pressure is 112/57 and her pulse is 73. Her oxygen saturation is 97%. .     Mouth: She has trismus. No significant mucositis remaining in her mouth. Some irregular tissue that is growing along the right gumline.    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  Ear: Erythematous telangiectasiac  patch in the right ear canal. Tympanic membrane is unremarkable. Skin: In the surgical area of her lateral right chin, right cheek and submental region the subcutaneous tissues are firm, indurated, slightly warm, tender, and there is a small opening where there is some whitish fluid that is draining.    Lab Findings: Lab Results  Component Value Date   WBC 13.2 08/12/2013   HGB 11.9* 04/15/2014   HCT 35.0* 04/15/2014  MCV 87.8 07/31/2013   PLT 343 08/12/2013   CMP     Component Value Date/Time   NA 141 12/01/2014 1128   NA 140 04/15/2014 1338   NA 140 08/24/2013   K 4.4 12/01/2014 1128   K 4.3 04/15/2014 1338   CL 103 04/15/2014 1338   CO2 26 12/01/2014 1128   CO2 22 08/05/2013 0507   GLUCOSE 107 12/01/2014 1128   GLUCOSE 94 04/15/2014 1338   BUN 19.5 12/01/2014 1128   BUN 20 04/15/2014 1338   BUN 27* 08/24/2013   CREATININE 1.3* 12/01/2014 1128   CREATININE 1.30* 04/15/2014 1338   CALCIUM 9.5 12/01/2014 1128   CALCIUM 8.8 08/05/2013 0507   GFRNONAA 37* 08/05/2013 0507   GFRAA 43* 08/05/2013 0507      Radiographic Findings: No results found.  Impression/Plan:   I have advised for the patient see Dr. Vicie Mutters to discuss the new facial swelling in operative/RT field... Cellulitis vs abscess?. Her daughter will call his clinic.  I have recommended that she try using aloe vera to help with her dry facial skin.  I have written a prescription for kefflex to be taken 3x daily for 12 days for possible infection.  Follow up in 2 months.  Continue palliative care visits at home.The patient will call if she has concerns before our next scheduled visit. She is agreeable to meeting with Mike Craze, NP if needed.  _____________________________________   [name], MD   This document serves as a record of services personally performed by [name], MD. It was created  on her behalf by Arlyce Harman, a trained medical scribe. The creation of this record is based on the scribe's personal observations and the provider's statements to them. This document has been checked and approved by the attending provider.

## 2014-12-27 ENCOUNTER — Encounter: Payer: Self-pay | Admitting: *Deleted

## 2014-12-27 NOTE — Progress Notes (Signed)
I received a call from [employer] (physical therapist) from [facility].  He is requesting additional home care for Mary [name].  Requesting the order to continue home care once a day for the next 4 weeks, starting today.  Notified Dr. Isidore Moos.

## 2015-01-12 ENCOUNTER — Emergency Department (HOSPITAL_COMMUNITY)
Admission: EM | Admit: 2015-01-12 | Discharge: 2015-01-12 | Disposition: A | Discharge status: Home / Self Care | Payer: Medicare Other | Attending: Emergency Medicine | Admitting: Emergency Medicine

## 2015-01-12 ENCOUNTER — Encounter (HOSPITAL_COMMUNITY): Payer: Self-pay

## 2015-01-12 DIAGNOSIS — Z792 Long term (current) use of antibiotics: Secondary | ICD-10-CM | POA: Insufficient documentation

## 2015-01-12 DIAGNOSIS — E039 Hypothyroidism, unspecified: Secondary | ICD-10-CM | POA: Insufficient documentation

## 2015-01-12 DIAGNOSIS — Z7982 Long term (current) use of aspirin: Secondary | ICD-10-CM | POA: Insufficient documentation

## 2015-01-12 DIAGNOSIS — Z79899 Other long term (current) drug therapy: Secondary | ICD-10-CM | POA: Diagnosis not present

## 2015-01-12 DIAGNOSIS — Z85828 Personal history of other malignant neoplasm of skin: Secondary | ICD-10-CM | POA: Diagnosis not present

## 2015-01-12 DIAGNOSIS — I10 Essential (primary) hypertension: Secondary | ICD-10-CM | POA: Diagnosis not present

## 2015-01-12 DIAGNOSIS — L03211 Cellulitis of face: Secondary | ICD-10-CM | POA: Diagnosis not present

## 2015-01-12 DIAGNOSIS — M1990 Unspecified osteoarthritis, unspecified site: Secondary | ICD-10-CM | POA: Insufficient documentation

## 2015-01-12 DIAGNOSIS — Z85818 Personal history of malignant neoplasm of other sites of lip, oral cavity, and pharynx: Secondary | ICD-10-CM | POA: Insufficient documentation

## 2015-01-12 DIAGNOSIS — R600 Localized edema: Secondary | ICD-10-CM | POA: Diagnosis present

## 2015-01-12 LAB — BASIC METABOLIC PANEL
Anion gap: 10 (ref 5–15)
BUN: 17 mg/dL (ref 6–20)
CALCIUM: 9.5 mg/dL (ref 8.9–10.3)
CO2: 24 mmol/L (ref 22–32)
CREATININE: 1.22 mg/dL — AB (ref 0.44–1.00)
Chloride: 102 mmol/L (ref 101–111)
GFR calc non Af Amer: 39 mL/min — ABNORMAL LOW (ref >60)
GFR, EST AFRICAN AMERICAN: 45 mL/min — AB (ref >60)
Glucose, Bld: 103 mg/dL — ABNORMAL HIGH (ref 65–99)
Potassium: 4.4 mmol/L (ref 3.5–5.1)
Sodium: 136 mmol/L (ref 135–145)

## 2015-01-12 LAB — CBC WITH DIFFERENTIAL/PLATELET
BASOS PCT: 0 %
Basophils Absolute: 0 10*3/uL (ref 0.0–0.1)
EOS ABS: 0.4 10*3/uL (ref 0.0–0.7)
Eosinophils Relative: 3 %
HCT: 30.9 % — ABNORMAL LOW (ref 36.0–46.0)
HEMOGLOBIN: 9.5 g/dL — AB (ref 12.0–15.0)
Lymphocytes Relative: 12 %
Lymphs Abs: 1.7 10*3/uL (ref 0.7–4.0)
MCH: 26.4 pg (ref 26.0–34.0)
MCHC: 30.7 g/dL (ref 30.0–36.0)
MCV: 85.8 fL (ref 78.0–100.0)
Monocytes Absolute: 1.2 10*3/uL — ABNORMAL HIGH (ref 0.1–1.0)
Monocytes Relative: 9 %
Neutro Abs: 11.2 10*3/uL — ABNORMAL HIGH (ref 1.7–7.7)
Neutrophils Relative %: 76 %
PLATELETS: 395 10*3/uL (ref 150–400)
RBC: 3.6 MIL/uL — AB (ref 3.87–5.11)
RDW: 13.8 % (ref 11.5–15.5)
WBC: 14.6 10*3/uL — AB (ref 4.0–10.5)

## 2015-01-12 MED ORDER — DOXYCYCLINE HYCLATE 100 MG PO TABS
100.0000 mg | ORAL_TABLET | Freq: Once | ORAL | Status: AC
  Administered 2015-01-12: 100 mg via ORAL
  Filled 2015-01-12: qty 1

## 2015-01-12 MED ORDER — DOXYCYCLINE HYCLATE 100 MG PO CAPS: 100.0000 mg | ORAL_CAPSULE | Freq: Two times a day (BID) | ORAL | Status: AC

## 2015-01-12 MED ORDER — FENTANYL CITRATE (PF) 100 MCG/2ML IJ SOLN
50.0000 ug | Freq: Once | INTRAMUSCULAR | Status: AC
  Administered 2015-01-12: 50 ug via INTRAVENOUS
  Filled 2015-01-12: qty 2

## 2015-01-12 NOTE — ED Notes (Signed)
Pt escorted to discharge window. Pt verbalized understanding discharge instructions. In no acute distress.  

## 2015-01-12 NOTE — ED Notes (Signed)
Pt and family report pt is being treated for recurrent squamous cell carcinoma of the right floor mouth. Pt had surgery in April 2016, finished receiving radiation in Oct 2016. Pt had biopsy last week that showed cancer is spreading to chin area. Pt finished abx for infection to chin area 1 week ago. Family reports there is still a chronic wound spot with pus, but is had not gotten worse since abx finished. Pain 5/10 at present. Family reports pt just agreed to hospice, and does not want further radiation.

## 2015-01-12 NOTE — ED Notes (Signed)
Faith-Marie, primary RN given update.

## 2015-01-12 NOTE — ED Notes (Signed)
md at bedside

## 2015-01-12 NOTE — ED Notes (Addendum)
Pt c/o increased R side facial swelling starting this morning.  Denies pain.  Hx of squamous cell carcinoma of mandibular alveolar ridge.  Pt had surgery in April 2016 and completed radiation in October 2016.

## 2015-01-12 NOTE — ED Provider Notes (Signed)
CSN: [id number]     Arrival date & time 01/12/15  1229 History   First MD Initiated Contact with Patient 01/12/15 1318     Chief Complaint  Patient presents with  . CA Pt   . Facial Swelling     The history is provided by the patient. No language interpreter was used.   Mary [name] is a 80 y.o. female who presents to the Emergency Department complaining of facial swelling and pain. 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 She has had progression of her disease. Over the last several weeks she's had draining and erythema at the right lower mandible. For the last few days she has increased swelling to the lower chin on the left and increased facial pain. She also has swelling to her right cheek that worsened today with local erythema. No reports of fever. She is not currently on antibiotics but has been on antibiotics in the past.  Past Medical History  Diagnosis Date  . Arthritis   . Thyroid disease   . Hypertension   . Hypothyroidism   . Peripheral vascular disease ([place])   . Frequency of urination   . Cancer [facility])     malignant neoplasm of right lower gum  . Skin cancer     skin cancer of nose   Past Surgical History  Procedure Laterality Date  . Tonsillectomy    . Eye surgery Bilateral bilateral  . Total hip arthroplasty Left 08/04/2013    Procedure: LEFT TOTAL HIP ARTHROPLASTY ANTERIOR APPROACH;  Surgeon: Renette Butters, MD;  Location: [facility];  Service: Orthopedics;  Laterality: Left;  Marland Kitchen Mucosal advancement flap     Family History  Problem Relation Age of Onset  . Stroke Mother   . Lung cancer Father   . Cancer Brother    Social History  Substance Use Topics  . Smoking status: Never Smoker   . Smokeless tobacco: Former Systems developer     Comment: used smokeless tobacco for at least 20 yesrs  . Alcohol Use: No   OB History    No data available     Review of Systems  All other systems reviewed  and are negative.     Allergies  Keflex  Home Medications   Prior to Admission medications   Medication Sig Start Date End Date Taking? Authorizing Provider  acetaminophen (TYLENOL) 500 MG tablet Take 500 mg by mouth every 6 (six) hours as needed for moderate pain.   Yes Historical Provider, MD  aspirin 81 MG tablet Take 81 mg by mouth daily.   Yes Historical Provider, MD  atorvastatin (LIPITOR) 10 MG tablet Take 10 mg by mouth daily.   Yes Historical Provider, MD  Cholecalciferol (VITAMIN D-3 PO) Take 5,000 Int'l Units by mouth 1 day or 1 dose.   Yes Historical Provider, MD  docusate sodium (COLACE) 100 MG capsule Take 1 capsule (100 mg total) by mouth 2 (two) times daily. Continue this while taking narcotics to help with bowel movements 08/04/13  Yes Renette Butters, MD  furosemide (LASIX) 20 MG tablet Take 10 mg by mouth daily.    Yes Historical Provider, MD  gabapentin (NEURONTIN) 100 MG capsule Take 200-300 mg by mouth 3 (three) times daily. 200 mg in the morning, 200mg  in the afternoon, and 300mg  at bedtime   Yes Historical Provider, MD  ibuprofen (ADVIL,MOTRIN) 200 MG tablet Take 200 mg by mouth  every 6 (six) hours as needed for headache.    Yes Historical Provider, MD  levothyroxine (SYNTHROID, LEVOTHROID) 50 MCG tablet Take 50 mcg by mouth daily before breakfast.   Yes Historical Provider, MD  lidocaine (XYLOCAINE) 2 % solution Patient: Mix 1part 2% viscous lidocaine, 1part H20. Swish and/or swallow 63mL of this mixture, 35min before meals and at bedtime, up to QID 11/15/14  Yes [name], MD  LORazepam (ATIVAN) 0.5 MG tablet Take 1 tablet 30-45 minutes before radiotherapy Patient taking differently: 0.5 mg at bedtime. Changed by primary MD to take at night as needed. 11/17/14  Yes [name], MD  losartan (COZAAR) 100 MG tablet Take 100 mg by mouth daily.   Yes Historical Provider, MD  Multiple Vitamins-Minerals (MULTIVITAMIN & MINERAL) LIQD Take 10 mLs by mouth daily.   Yes  Historical Provider, MD  oxyCODONE (ROXICODONE) 5 MG immediate release tablet Take 2.5 mg by mouth every 6 (six) hours as needed. 01/05/15  Yes Historical Provider, MD  polyethylene glycol (MIRALAX / GLYCOLAX) packet Take 17 g by mouth daily as needed for moderate constipation.   Yes Historical Provider, MD  psyllium (REGULOID) 0.52 G capsule Take 0.52 g by mouth daily as needed (constipation).   Yes Historical Provider, MD  sodium fluoride (PREVIDENT 5000 PLUS) 1.1 % CREA dental cream Apply to tooth brush. Brush teeth for 2 minutes. Spit out excess-DO NOT swallow. Repeat nightly. 10/28/14  Yes Lenn Cal, DDS  traMADol (ULTRAM) 50 MG tablet Take 1 tablet (50 mg total) by mouth every 12 (twelve) hours as needed. Patient taking differently: Take 50 mg by mouth every 6 (six) hours as needed (changed by primary MD to every 6 hours prn).  11/25/14  Yes Arloa Koh, MD  verapamil (COVERA HS) 240 MG (CO) 24 hr tablet Take 240 mg by mouth daily.    Yes Historical Provider, MD  cephALEXin (KEFLEX) 500 MG capsule Take 1 capsule (500 mg total) by mouth 3 (three) times daily. 12/22/14   [name], MD  HYDROcodone-acetaminophen (NORCO/VICODIN) 5-325 MG per tablet Take 1 tablet by mouth every 4 (four) hours as needed for moderate pain. Patient not taking: Reported on 11/15/2014 11/08/14   [name], MD   BP 120/62 mmHg  Pulse 85  Temp(Src) 98.5 F (36.9 C) (Oral)  Resp 16  SpO2 99% Physical Exam  Constitutional: She is oriented to person, place, and time. She appears well-developed and well-nourished.  HENT:  Head: Normocephalic.  Moderate erythema and edema to right cheek, deformity to right mandible with central ulceration with moderate purulent drainage and tenderness.  Submental region with moderate erythema and edema.   Cardiovascular: Normal rate and regular rhythm.   No murmur heard. Pulmonary/Chest: Effort normal and breath sounds normal. No respiratory distress.  Abdominal: Soft.  There is no tenderness. There is no rebound and no guarding.  Musculoskeletal: She exhibits no edema or tenderness.  Neurological: She is alert and oriented to person, place, and time.  Skin: Skin is warm and dry.  Psychiatric: She has a normal mood and affect. Her behavior is normal.  Nursing note and vitals reviewed.   ED Course  Procedures (including critical care time) Labs Review Labs Reviewed  BASIC METABOLIC PANEL - Abnormal; Notable for the following:    Glucose, Bld 103 (*)    Creatinine, Ser 1.22 (*)    GFR calc non Af Amer 39 (*)    GFR calc Af Amer 45 (*)    All other components within  normal limits  CBC WITH DIFFERENTIAL/PLATELET - Abnormal; Notable for the following:    WBC 14.6 (*)    RBC 3.60 (*)    Hemoglobin 9.5 (*)    HCT 30.9 (*)    Neutro Abs 11.2 (*)    Monocytes Absolute 1.2 (*)    All other components within normal limits    Imaging Review No results found. I have personally reviewed and evaluated these images and lab results as part of my medical decision-making.   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 She is no longer trying any more treatment and is transitioning over to positive care and hospice. Examination is concerning for progression of her tumor burden as well as cellulitis. Discussed with patient her treatment options of IV antibiotics versus discharge home on oral antibiotics and patient prefers home management. She currently has no evidence of airway compromise but discussed that this is a possibility. Home care and return precautions were discussed.   Quintella Reichert, MD 01/12/15 [phone]

## 2015-01-12 NOTE — Discharge Instructions (Signed)

## 2015-02-22 ENCOUNTER — Encounter: Payer: Self-pay | Admitting: *Deleted

## 2015-02-22 NOTE — Progress Notes (Signed)
  Oncology Nurse Navigator Documentation  [facility] and Record, February 22, 2015  [facility] [place], died 20-Feb-2015 at her home.

## 2015-02-25 ENCOUNTER — Ambulatory Visit: Payer: Medicare Other | Admitting: Radiation Oncology

## 2015-03-04 ENCOUNTER — Ambulatory Visit: Payer: Medicare Other | Admitting: Radiation Oncology

## 2015-03-16 DEATH — deceased

## 2015-04-07 ENCOUNTER — Telehealth: Payer: Self-pay | Admitting: *Deleted

## 2015-10-25 IMAGING — CT CT NECK W/ CM
4 of 5 series · 16 of 33 positions shown, 18 images · IV contrast (OMNIPAQUE)
Comparison: PET-CT earlier today

CLINICAL DATA: Malignant neoplasm of right lower gum, diagnosed
[DATE].

EXAM:
CT NECK WITH CONTRAST
TECHNIQUE: Multidetector CT imaging of the neck was performed using the
standard protocol following the bolus administration of intravenous
contrast.
CONTRAST:  100mL OMNIPAQUE IOHEXOL 300 MG/ML  SOLN

[Series 2: neck st · axial · 0.39mm/px · z∈[-46,+92]mm · 4 of 117 slices shown, 5 images]
[im 24/117  soft-tissue]
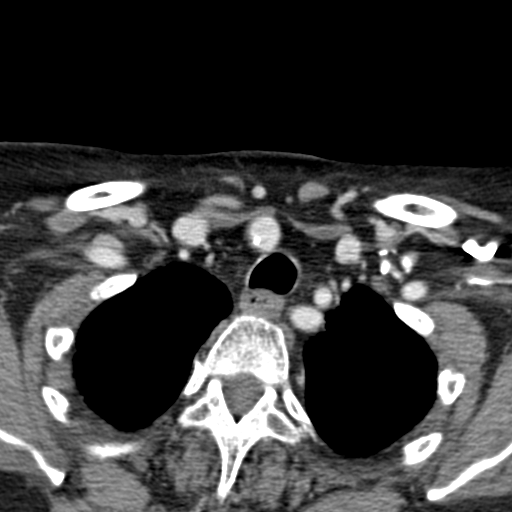
[im 24/117  bone]
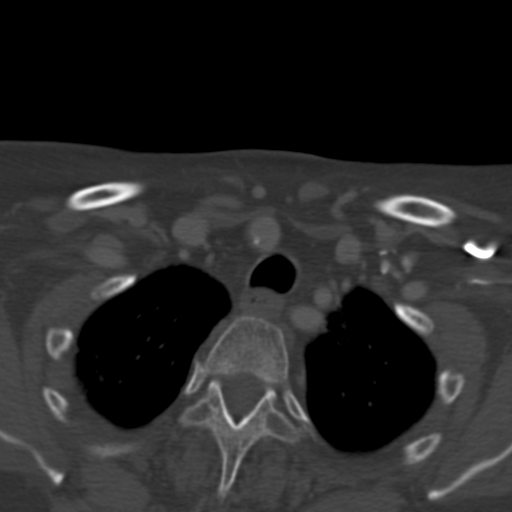
[im 47/117  bone]
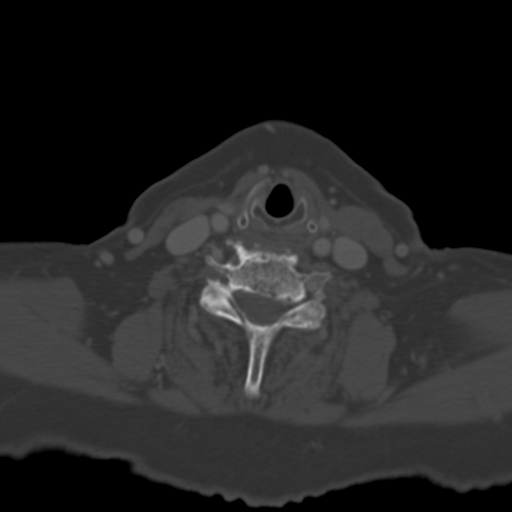
[im 70/117  bone]
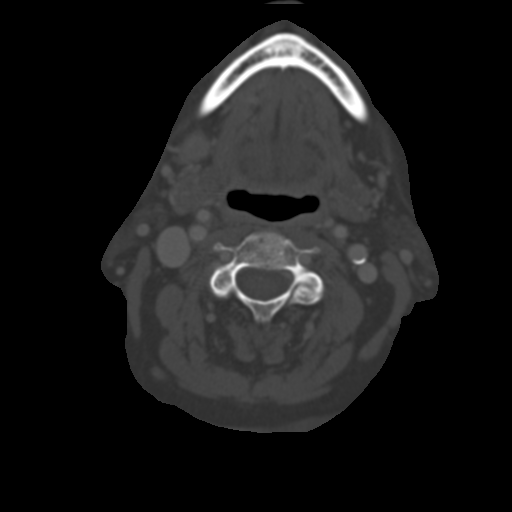
[im 93/117  bone]
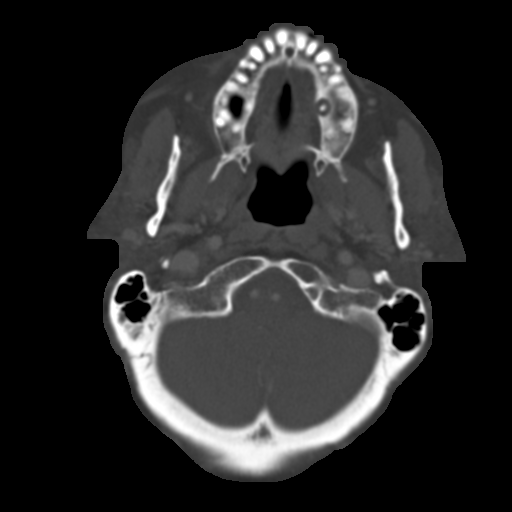

[Series 602: <mpr thick range> · coronal · 0.46mm/px · 3 of 82 slices shown]
[im 22/82  bone]
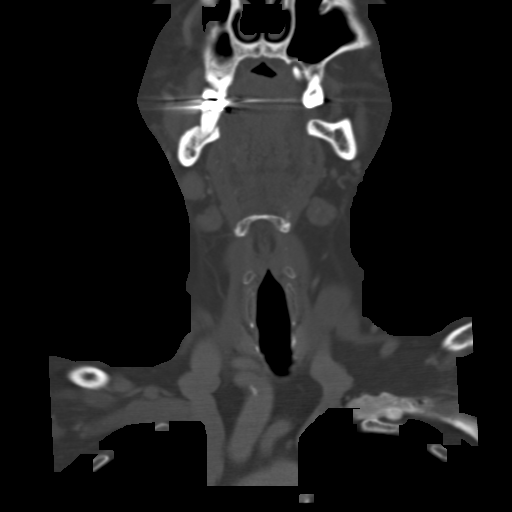
[im 35/82  bone]
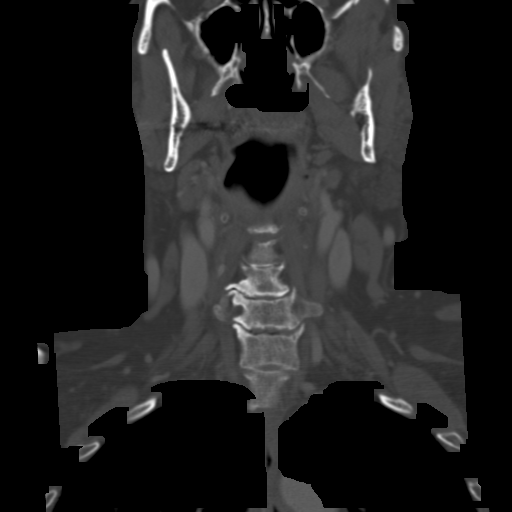
[im 48/82  bone]
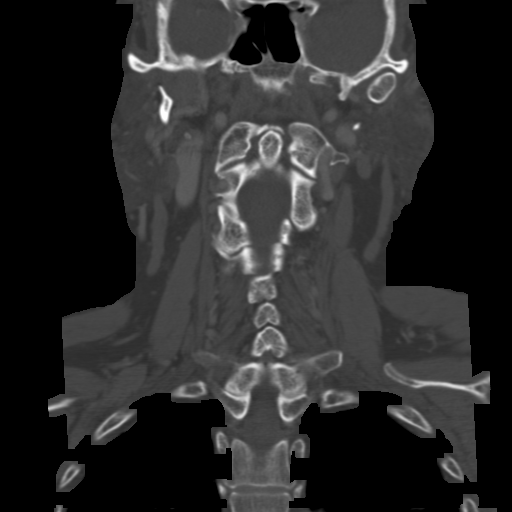

[Series 603: <mpr thick range(1)> · axial · 0.46mm/px · z∈[-91,+31]mm · 4 of 111 slices shown]
[im 23/111  bone]
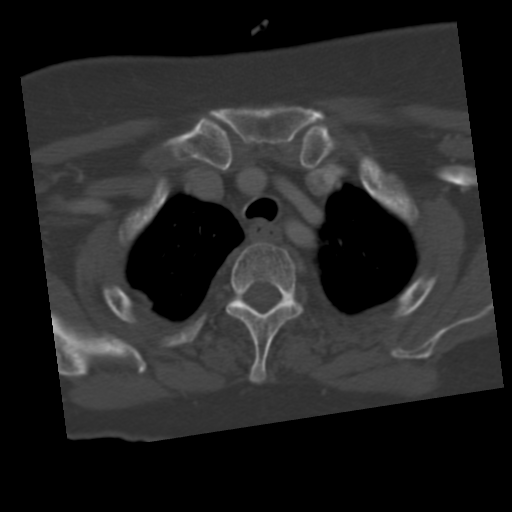
[im 45/111  bone]
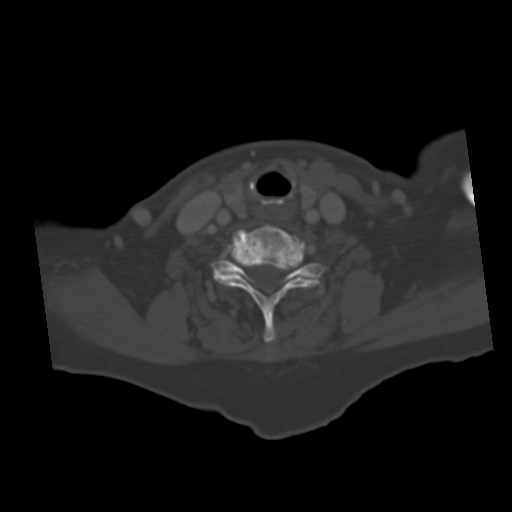
[im 67/111  bone]
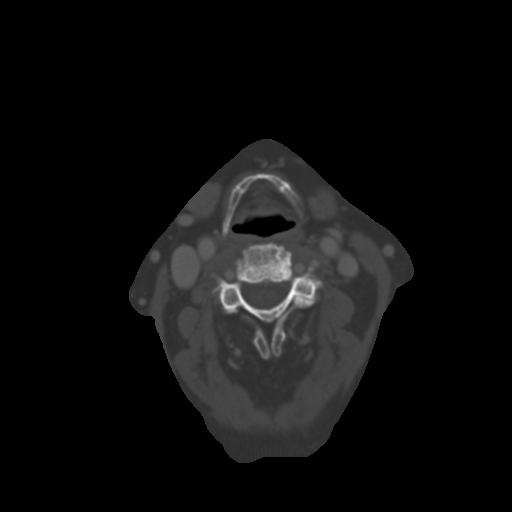
[im 89/111  bone]
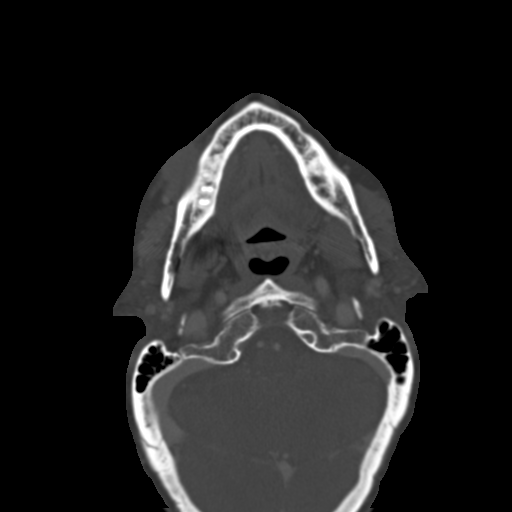

[Series 604: <mpr thick range(2)> · sagittal · 0.46mm/px · 5 of 97 slices shown, 6 images]
[im 33/97  bone]
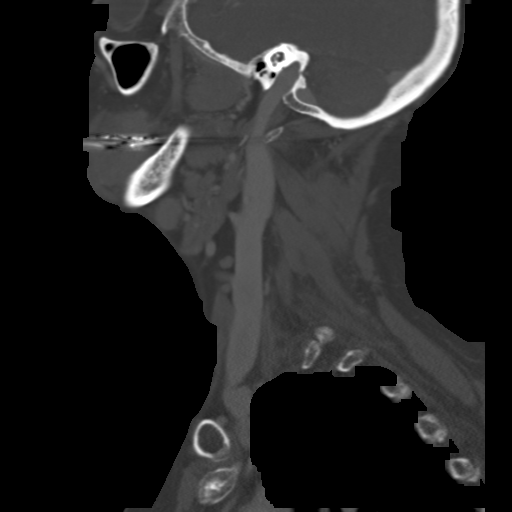
[im 41/97  bone]
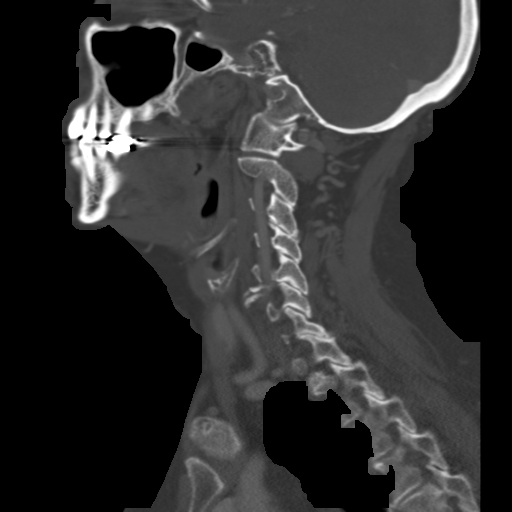
[im 49/97  soft-tissue]
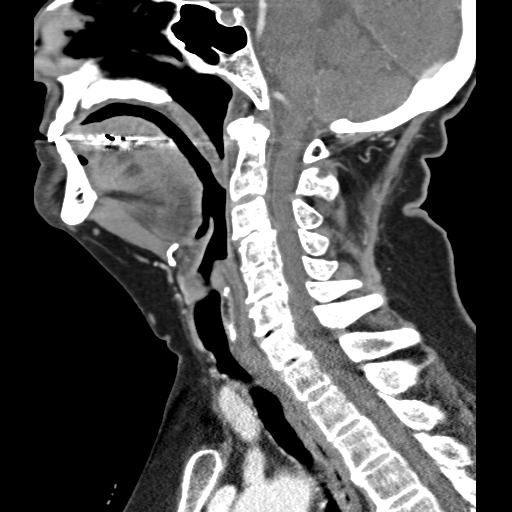
[im 49/97  bone]
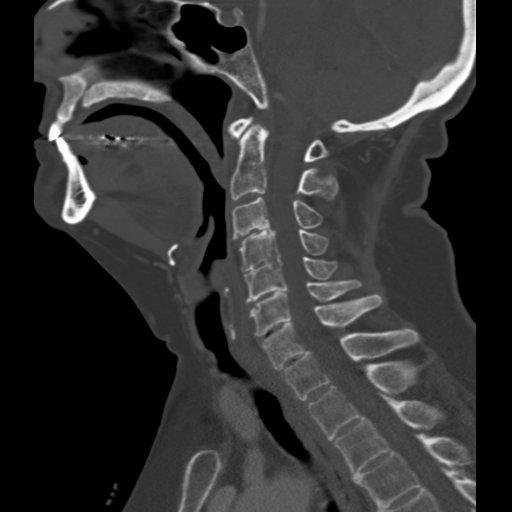
[im 57/97  bone]
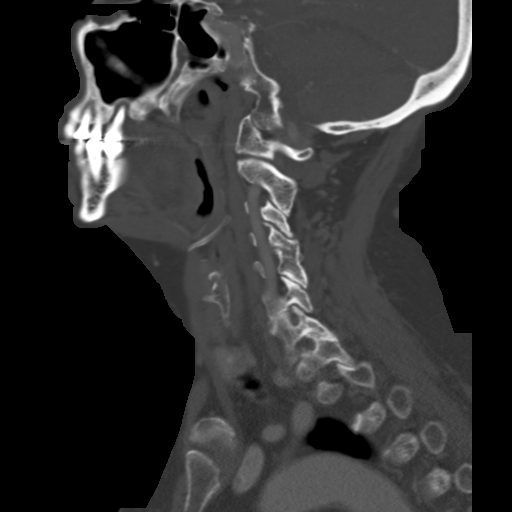
[im 65/97  bone]
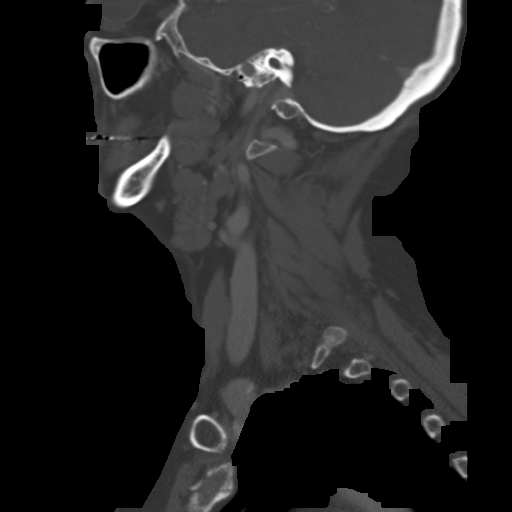

[16 of 33 positions shown; findings below may reference images not displayed]

FINDINGS: Pharynx and larynx: The nasopharynx, oropharynx, and larynx are
unremarkable. There is a soft tissue mass along the buccal surface
of the right mandibular alveolar ridge which measures approximately
3.3 x 1.4 cm and corresponds to the hypermetabolism on head CT. The
superior portion of the mass is partially obscured by metallic
dental streak artifact. There is also asymmetric, thin soft tissue
extending along the lingual surface of the right mandible which
measures up to 5 mm in thickness. There is no definite osseous
erosion of the adjacent mandible. Mild-to-moderate periapical
lucency is noted involving the left maxillary first molar.

Salivary glands: Submandibular and parotid glands are unremarkable.

Thyroid: A 5 mm right thyroid nodule is incidentally noted.

Lymph nodes: Enlarged, hypermetabolic right level IB lymph node
measures 1.5 x 1.2 cm. No enlarged lymph nodes are identified
elsewhere in the neck.

Vascular: Major vascular structures of the neck appear patent. There
is asymmetric atherosclerotic calcification involving the proximal
right internal carotid artery resulting in approximately 50%
stenosis.

Limited intracranial: The visualized portion of the brain is
unremarkable.

Visualized orbits: Prior bilateral cataract extraction.

Mastoids and visualized paranasal sinuses: Clear.

Skeleton: Advanced multilevel disc and facet degeneration are
present throughout the cervical spine there is fusion across the
C4-5 disc space and left facet joint. No lytic or blastic osseous
lesions are identified.

Upper chest: Minimal scarring is noted in the lung apices.
IMPRESSION: Right-sided oral cavity mass consistent with known malignancy.
Enlarged right level IB lymph node consistent with nodal metastasis.
